# Patient Record
Sex: Male | Born: 1955 | Race: Black or African American | Hispanic: No | Smoking: Never smoker
Health system: Southern US, Community
[De-identification: ages and names within clinical notes are randomized; demographics above are authoritative.]

## PROBLEM LIST (undated history)

## (undated) DIAGNOSIS — M109 Gout, unspecified: Secondary | ICD-10-CM

## (undated) DIAGNOSIS — IMO0002 Reserved for concepts with insufficient information to code with codable children: Secondary | ICD-10-CM

## (undated) DIAGNOSIS — K449 Diaphragmatic hernia without obstruction or gangrene: Secondary | ICD-10-CM

## (undated) DIAGNOSIS — K5732 Diverticulitis of large intestine without perforation or abscess without bleeding: Secondary | ICD-10-CM

## (undated) DIAGNOSIS — R911 Solitary pulmonary nodule: Secondary | ICD-10-CM

## (undated) DIAGNOSIS — F419 Anxiety disorder, unspecified: Secondary | ICD-10-CM

## (undated) DIAGNOSIS — E669 Obesity, unspecified: Secondary | ICD-10-CM

## (undated) DIAGNOSIS — K219 Gastro-esophageal reflux disease without esophagitis: Secondary | ICD-10-CM

## (undated) DIAGNOSIS — M8580 Other specified disorders of bone density and structure, unspecified site: Secondary | ICD-10-CM

## (undated) DIAGNOSIS — R002 Palpitations: Secondary | ICD-10-CM

## (undated) DIAGNOSIS — E785 Hyperlipidemia, unspecified: Secondary | ICD-10-CM

## (undated) DIAGNOSIS — K649 Unspecified hemorrhoids: Secondary | ICD-10-CM

## (undated) HISTORY — DX: Unspecified hemorrhoids: K64.9

## (undated) HISTORY — DX: Hyperlipidemia, unspecified: E78.5

## (undated) HISTORY — DX: Diverticulitis of large intestine without perforation or abscess without bleeding: K57.32

## (undated) HISTORY — DX: Anxiety disorder, unspecified: F41.9

## (undated) HISTORY — DX: Reserved for concepts with insufficient information to code with codable children: IMO0002

## (undated) HISTORY — DX: Other specified disorders of bone density and structure, unspecified site: M85.80

## (undated) HISTORY — DX: Obesity, unspecified: E66.9

## (undated) HISTORY — DX: Solitary pulmonary nodule: R91.1

## (undated) HISTORY — PX: HEMORRHOID BANDING: SHX5850

## (undated) HISTORY — DX: Palpitations: R00.2

## (undated) HISTORY — PX: COLONOSCOPY: SHX174

---

## 1998-05-08 ENCOUNTER — Emergency Department (HOSPITAL_COMMUNITY): Admission: EM | Admit: 1998-05-08 | Discharge: 1998-05-08 | Payer: Self-pay | Admitting: Emergency Medicine

## 1998-05-28 ENCOUNTER — Emergency Department (HOSPITAL_COMMUNITY): Admission: EM | Admit: 1998-05-28 | Discharge: 1998-05-28 | Payer: Self-pay | Admitting: Emergency Medicine

## 1998-05-29 ENCOUNTER — Emergency Department (HOSPITAL_COMMUNITY): Admission: EM | Admit: 1998-05-29 | Discharge: 1998-05-29 | Payer: Self-pay | Admitting: Emergency Medicine

## 1998-05-30 ENCOUNTER — Emergency Department (HOSPITAL_COMMUNITY): Admission: EM | Admit: 1998-05-30 | Discharge: 1998-05-30 | Payer: Self-pay | Admitting: Emergency Medicine

## 1998-06-03 ENCOUNTER — Emergency Department (HOSPITAL_COMMUNITY): Admission: EM | Admit: 1998-06-03 | Discharge: 1998-06-03 | Payer: Self-pay | Admitting: Emergency Medicine

## 1999-05-28 ENCOUNTER — Emergency Department (HOSPITAL_COMMUNITY): Admission: EM | Admit: 1999-05-28 | Discharge: 1999-05-28 | Payer: Self-pay | Admitting: Emergency Medicine

## 2000-02-24 ENCOUNTER — Emergency Department (HOSPITAL_COMMUNITY): Admission: EM | Admit: 2000-02-24 | Discharge: 2000-02-24 | Payer: Self-pay | Admitting: Emergency Medicine

## 2000-04-28 ENCOUNTER — Ambulatory Visit (HOSPITAL_COMMUNITY): Admission: RE | Admit: 2000-04-28 | Discharge: 2000-04-28 | Payer: Self-pay | Admitting: Gastroenterology

## 2000-08-28 ENCOUNTER — Emergency Department (HOSPITAL_COMMUNITY): Admission: EM | Admit: 2000-08-28 | Discharge: 2000-08-28 | Payer: Self-pay | Admitting: Emergency Medicine

## 2000-08-28 ENCOUNTER — Encounter: Payer: Self-pay | Admitting: Emergency Medicine

## 2001-02-15 ENCOUNTER — Emergency Department (HOSPITAL_COMMUNITY): Admission: EM | Admit: 2001-02-15 | Discharge: 2001-02-16 | Payer: Self-pay | Admitting: Emergency Medicine

## 2001-05-28 ENCOUNTER — Emergency Department (HOSPITAL_COMMUNITY): Admission: EM | Admit: 2001-05-28 | Discharge: 2001-05-28 | Payer: Self-pay | Admitting: Emergency Medicine

## 2002-06-04 ENCOUNTER — Emergency Department (HOSPITAL_COMMUNITY): Admission: EM | Admit: 2002-06-04 | Discharge: 2002-06-05 | Payer: Self-pay | Admitting: *Deleted

## 2004-12-10 ENCOUNTER — Emergency Department (HOSPITAL_COMMUNITY): Admission: EM | Admit: 2004-12-10 | Discharge: 2004-12-10 | Payer: Self-pay | Admitting: Emergency Medicine

## 2005-02-16 ENCOUNTER — Emergency Department (HOSPITAL_COMMUNITY): Admission: EM | Admit: 2005-02-16 | Discharge: 2005-02-16 | Payer: Self-pay | Admitting: Emergency Medicine

## 2005-05-16 ENCOUNTER — Emergency Department (HOSPITAL_COMMUNITY): Admission: EM | Admit: 2005-05-16 | Discharge: 2005-05-17 | Payer: Self-pay | Admitting: Emergency Medicine

## 2006-05-03 ENCOUNTER — Encounter: Admission: RE | Admit: 2006-05-03 | Discharge: 2006-05-03 | Payer: Self-pay | Admitting: Internal Medicine

## 2006-05-28 ENCOUNTER — Emergency Department (HOSPITAL_COMMUNITY): Admission: EM | Admit: 2006-05-28 | Discharge: 2006-05-28 | Payer: Self-pay | Admitting: Emergency Medicine

## 2007-10-24 ENCOUNTER — Emergency Department (HOSPITAL_COMMUNITY): Admission: EM | Admit: 2007-10-24 | Discharge: 2007-10-24 | Payer: Self-pay | Admitting: Emergency Medicine

## 2008-06-08 ENCOUNTER — Emergency Department (HOSPITAL_COMMUNITY): Admission: EM | Admit: 2008-06-08 | Discharge: 2008-06-08 | Payer: Self-pay | Admitting: Emergency Medicine

## 2008-08-12 ENCOUNTER — Emergency Department (HOSPITAL_COMMUNITY): Admission: EM | Admit: 2008-08-12 | Discharge: 2008-08-13 | Payer: Self-pay | Admitting: Emergency Medicine

## 2009-05-01 ENCOUNTER — Emergency Department (HOSPITAL_COMMUNITY): Admission: EM | Admit: 2009-05-01 | Discharge: 2009-05-01 | Payer: Self-pay | Admitting: Family Medicine

## 2010-10-23 ENCOUNTER — Emergency Department (HOSPITAL_COMMUNITY): Admission: EM | Admit: 2010-10-23 | Discharge: 2009-11-23 | Payer: Self-pay | Admitting: Emergency Medicine

## 2011-02-09 ENCOUNTER — Inpatient Hospital Stay (INDEPENDENT_AMBULATORY_CARE_PROVIDER_SITE_OTHER)
Admission: RE | Admit: 2011-02-09 | Discharge: 2011-02-09 | Disposition: A | Discharge status: Home / Self Care | Payer: Self-pay | Source: Ambulatory Visit | Attending: Emergency Medicine | Admitting: Emergency Medicine

## 2011-02-09 DIAGNOSIS — K6289 Other specified diseases of anus and rectum: Secondary | ICD-10-CM

## 2011-02-09 DIAGNOSIS — J029 Acute pharyngitis, unspecified: Secondary | ICD-10-CM

## 2011-02-10 ENCOUNTER — Encounter (INDEPENDENT_AMBULATORY_CARE_PROVIDER_SITE_OTHER): Payer: Self-pay | Admitting: *Deleted

## 2011-02-17 NOTE — Letter (Signed)
Summary: Pre Visit Letter Revised  [facility]  [address] [place], [place] [address]   Phone: [phone]  Fax: [phone]        02/10/2011 MRN: [id number] Jerry Larson  [address] [name], [place]  [address]  [place]             Procedure Date:  March 04, 2011   dir col Dr Jarold Motto   Welcome to the Gastroenterology Division at [facility].    You are scheduled to see a nurse for your pre-procedure visit on February 18, 2011 at 11:00am on the 3rd floor at [facility], [address]  We ask that you try to arrive at our office 15 minutes prior to your appointment time to allow for check-in.  Please take a minute to review the attached form.  If you answer "Yes" to one or more of the questions on the first page, we ask that you call the person listed at your earliest opportunity.  If you answer "No" to all of the questions, please complete the rest of the form and bring it to your appointment.    Your nurse visit will consist of discussing your medical and surgical history, your immediate family medical history, and your medications.   If you are unable to list all of your medications on the form, please bring the medication bottles to your appointment and we will list them.  We will need to be aware of both prescribed and over the counter drugs.  We will need to know exact dosage information as well.    Please be prepared to read and sign documents such as consent forms, a financial agreement, and acknowledgement forms.  If necessary, and with your consent, a friend or relative is welcome to sit-in on the nurse visit with you.  Please bring your insurance card so that we may make a copy of it.  If your insurance requires a referral to see a specialist, please bring your referral form from your primary care physician.  No co-pay is required for this nurse visit.     If you cannot keep your appointment, please call [phone] to cancel or  reschedule prior to your appointment date.  This allows [place] the opportunity to schedule an appointment for another patient in need of care.    Thank you for choosing [facility] for your medical needs.  We appreciate the opportunity to care for you.  Please visit [place] at our website  to learn more about our practice.  Sincerely, The Gastroenterology Division

## 2011-02-18 ENCOUNTER — Ambulatory Visit (AMBULATORY_SURGERY_CENTER): Payer: Self-pay | Admitting: *Deleted

## 2011-02-18 VITALS — Ht 65.0 in | Wt 223.0 lb

## 2011-02-18 DIAGNOSIS — Z1211 Encounter for screening for malignant neoplasm of colon: Secondary | ICD-10-CM

## 2011-02-18 MED ORDER — PEG-KCL-NACL-NASULF-NA ASC-C 100 G PO SOLR: 1.0000 | Freq: Once | ORAL | Status: AC

## 2011-02-23 LAB — POCT I-STAT, CHEM 8
BUN: 15 mg/dL (ref 6–23)
Calcium, Ion: 1.25 mmol/L (ref 1.12–1.32)
Chloride: 103 mEq/L (ref 96–112)
Creatinine, Ser: 1.2 mg/dL (ref 0.4–1.5)
Glucose, Bld: 87 mg/dL (ref 70–99)
TCO2: 30 mmol/L (ref 0–100)

## 2011-03-03 ENCOUNTER — Other Ambulatory Visit: Payer: Self-pay | Admitting: *Deleted

## 2011-03-03 ENCOUNTER — Telehealth: Payer: Self-pay | Admitting: Gastroenterology

## 2011-03-03 ENCOUNTER — Encounter: Payer: Self-pay | Admitting: Gastroenterology

## 2011-03-03 DIAGNOSIS — Z1211 Encounter for screening for malignant neoplasm of colon: Secondary | ICD-10-CM

## 2011-03-03 NOTE — Telephone Encounter (Signed)
Unable to locate [facility] at pt's location.  Called patient and got [facility] preferred [facility].  Called in [facility] as per original order.

## 2011-03-04 ENCOUNTER — Encounter: Payer: Self-pay | Admitting: Gastroenterology

## 2011-03-04 ENCOUNTER — Ambulatory Visit (AMBULATORY_SURGERY_CENTER): Payer: Self-pay | Admitting: Gastroenterology

## 2011-03-04 VITALS — BP 119/64 | HR 62 | Temp 97.3°F | Resp 15 | Ht 65.0 in | Wt 223.0 lb

## 2011-03-04 DIAGNOSIS — Z1211 Encounter for screening for malignant neoplasm of colon: Secondary | ICD-10-CM

## 2011-03-04 DIAGNOSIS — K6289 Other specified diseases of anus and rectum: Secondary | ICD-10-CM

## 2011-03-04 DIAGNOSIS — K625 Hemorrhage of anus and rectum: Secondary | ICD-10-CM

## 2011-03-04 DIAGNOSIS — K5730 Diverticulosis of large intestine without perforation or abscess without bleeding: Secondary | ICD-10-CM

## 2011-03-04 MED ORDER — SODIUM CHLORIDE 0.9 % IV SOLN: 500.0000 mL | INTRAVENOUS | Status: DC

## 2011-03-04 NOTE — Patient Instructions (Addendum)
Please refer to your blue and green sheets for D/C instructions  Findings: Diverticulosis-Handout given  Please eat a diet that is high in fiber  Repeat exam in 10 years (2022)

## 2011-03-05 ENCOUNTER — Telehealth: Payer: Self-pay | Admitting: *Deleted

## 2011-03-05 NOTE — Telephone Encounter (Signed)

## 2011-04-03 NOTE — Procedures (Signed)
[place]  Patient:    Jerry Larson, Jerry Larson                        MRN: [id number] Proc. Date: 04/28/00 Adm. Date:  [id number] Disc. Date: [id number] Attending:  Charna Elizabeth CC:         Kern Reap, M.D.                           Procedure Report  DATE OF BIRTH:  1956/04/20  REFERRING PHYSICIAN:  Kern Reap, M.D.  PROCEDURE PERFORMED:  Colonoscopy.  ENDOSCOPIST:  Anselmo Rod, M.D.  INSTRUMENT USED:  Olympus video colonoscope.  INDICATIONS FOR PROCEDURE:  Rectal bleeding and family history of colon cancer in a 55 year old black male rule out colonic polyps, arteriovenous malformations, masses, hemorrhoids, etc.  PREPROCEDURE PREPARATION:  Informed consent was procured from the patient. The patient was fasted for eight hours prior to the procedure and prepped with a bottle of magnesium citrate and a gallon of NuLytely the night prior to the procedure.  PREPROCEDURE PHYSICAL:  The patient had stable vital signs.  Neck supple. Chest clear to auscultation.  S1, S2 regular.  Abdomen soft with normal abdominal bowel sounds.  No hepatosplenomegaly, no masses palpable.  DESCRIPTION OF PROCEDURE:  The patient was placed in the left lateral decubitus position and sedated with 75 mg of Demerol and 6 mg of Versed intravenously.  Once the patient was adequately sedated and maintained on low-flow oxygen and continuous cardiac monitoring, the Olympus video colonoscope was advanced from the rectum to the cecum without difficulty. Except for small internal hemorrhoids, no other abnormality was seen.  There was no evidence of masses, polyps, erosions, ulcerations or diverticulosis. The procedure was complete to the cecum.  The appendicular orifice and the ileocecal valve were clearly visualized and appeared normal.  IMPRESSION:  Normal colonoscopy except for small nonbleeding internal hemorrhoids.  RECOMMENDATIONS:  The patient has been  advised to increase the fluid and fiber in his diet and follow up in the office in the next two weeks.  Repeat colonoscopy recommended in the next five years or earlier if he develops any symptoms in the interim considering his family history of colon cancer.DD: 04/28/00 TD:  05/02/00 Job: [id number]

## 2011-05-07 ENCOUNTER — Inpatient Hospital Stay (INDEPENDENT_AMBULATORY_CARE_PROVIDER_SITE_OTHER)
Admission: RE | Admit: 2011-05-07 | Discharge: 2011-05-07 | Disposition: A | Discharge status: Home / Self Care | Payer: Self-pay | Source: Ambulatory Visit | Attending: Family Medicine | Admitting: Family Medicine

## 2011-05-07 DIAGNOSIS — R6889 Other general symptoms and signs: Secondary | ICD-10-CM

## 2011-05-07 LAB — POCT I-STAT, CHEM 8
BUN: 13 mg/dL (ref 6–23)
Creatinine, Ser: 1.3 mg/dL (ref 0.50–1.35)
Glucose, Bld: 103 mg/dL — ABNORMAL HIGH (ref 70–99)
Hemoglobin: 16.7 g/dL (ref 13.0–17.0)
TCO2: 26 mmol/L (ref 0–100)

## 2011-07-01 NOTE — Progress Notes (Signed)
Addended by: Virgel Paling on: 07/01/2011 04:54 PM   Modules accepted: Orders, Level of Service

## 2011-07-02 ENCOUNTER — Inpatient Hospital Stay (INDEPENDENT_AMBULATORY_CARE_PROVIDER_SITE_OTHER)
Admission: RE | Admit: 2011-07-02 | Discharge: 2011-07-02 | Disposition: A | Discharge status: Home / Self Care | Payer: Self-pay | Source: Ambulatory Visit | Attending: Emergency Medicine | Admitting: Emergency Medicine

## 2011-07-02 ENCOUNTER — Ambulatory Visit (INDEPENDENT_AMBULATORY_CARE_PROVIDER_SITE_OTHER): Payer: Self-pay

## 2011-07-02 DIAGNOSIS — IMO0002 Reserved for concepts with insufficient information to code with codable children: Secondary | ICD-10-CM

## 2011-07-02 LAB — POCT URINALYSIS DIP (DEVICE)
Hgb urine dipstick: NEGATIVE
Ketones, ur: NEGATIVE mg/dL
Protein, ur: NEGATIVE mg/dL
Specific Gravity, Urine: 1.02 (ref 1.005–1.030)
Urobilinogen, UA: 0.2 mg/dL (ref 0.0–1.0)

## 2011-08-24 LAB — COMPREHENSIVE METABOLIC PANEL
AST: 30
BUN: 9
CO2: 28
Calcium: 9.2
Chloride: 104
Creatinine, Ser: 1.03
GFR calc Af Amer: >60
GFR calc non Af Amer: >60
Total Bilirubin: 1

## 2011-08-24 LAB — POCT CARDIAC MARKERS
CKMB, poc: 2.5
Myoglobin, poc: 109
Myoglobin, poc: 158
Operator id: 4001
Operator id: 4295
Troponin i, poc: <0.05
Troponin i, poc: <0.05

## 2011-08-24 LAB — CBC
HCT: 40.9
MCHC: 33.6
MCV: 89.9
RBC: 4.55

## 2011-08-24 LAB — DIFFERENTIAL
Basophils Absolute: 0.1
Eosinophils Relative: 2
Lymphocytes Relative: 40
Lymphs Abs: 2.3
Neutro Abs: 2.7
Neutrophils Relative %: 49

## 2011-08-24 LAB — D-DIMER, QUANTITATIVE: D-Dimer, Quant: <0.22

## 2011-11-29 ENCOUNTER — Emergency Department (HOSPITAL_COMMUNITY)
Admission: EM | Admit: 2011-11-29 | Discharge: 2011-11-29 | Disposition: A | Discharge status: Home / Self Care | Payer: Self-pay | Source: Home / Self Care | Attending: Emergency Medicine | Admitting: Emergency Medicine

## 2011-11-29 ENCOUNTER — Emergency Department (INDEPENDENT_AMBULATORY_CARE_PROVIDER_SITE_OTHER): Payer: Self-pay

## 2011-11-29 ENCOUNTER — Encounter (HOSPITAL_COMMUNITY): Payer: Self-pay | Admitting: *Deleted

## 2011-11-29 DIAGNOSIS — S93409A Sprain of unspecified ligament of unspecified ankle, initial encounter: Secondary | ICD-10-CM

## 2011-11-29 MED ORDER — MELOXICAM 7.5 MG PO TABS: 7.5000 mg | ORAL_TABLET | Freq: Every day | ORAL | Status: DC

## 2011-11-29 NOTE — ED Provider Notes (Signed)
History     CSN: [id number]  Arrival date & time 11/29/11  1117   First MD Initiated Contact with Patient 11/29/11 1127      Chief Complaint  Patient presents with  . Ankle Pain    (Consider location/radiation/quality/duration/timing/severity/associated sxs/prior treatment) HPI Comments: Ankle pain last night my twisted my R ankle and fell, this morning woke up and its swollen and hurts when i walk on it"  "  Patient is a 56 y.o. male presenting with ankle pain. The history is provided by the patient.  Ankle Pain  The incident occurred yesterday. The incident occurred at work. The injury mechanism was a fall and torsion. The pain is present in the right ankle. The pain is at a severity of 3/10. The pain is mild. Pertinent negatives include no numbness, no inability to bear weight, no muscle weakness, no loss of sensation and no tingling. The symptoms are aggravated by activity and bearing weight. He has tried nothing for the symptoms. The treatment provided no relief.    Past Medical History  Diagnosis Date  . Hyperlipidemia     Past Surgical History  Procedure Date  . Colonoscopy     Family History  Problem Relation Age of Onset  . Pancreatic cancer Father     History  Substance Use Topics  . Smoking status: Never Smoker   . Smokeless tobacco: Not on file  . Alcohol Use: No      Review of Systems  Constitutional: Negative for fever and fatigue.  Musculoskeletal: Positive for joint swelling. Negative for back pain.  Neurological: Negative for tingling, weakness and numbness.    Allergies  Lipitor and Zocor  Home Medications   Current Outpatient Rx  Name Route Sig Dispense Refill  . FAMOTIDINE 10 MG PO TABS Oral Take 10 mg by mouth 2 (two) times daily.      BP 125/73  Pulse 68  Temp(Src) 98.9 F (37.2 C) (Oral)  Resp 17  SpO2 97%  Physical Exam  Nursing note and vitals reviewed. Constitutional: He appears well-nourished. No distress.    Musculoskeletal: He exhibits tenderness. He exhibits no edema.       Right ankle: He exhibits decreased range of motion and swelling. He exhibits no ecchymosis, no deformity and normal pulse. tenderness. Lateral malleolus tenderness found. No medial malleolus tenderness found. Achilles tendon normal. Achilles tendon exhibits no pain.  Neurological: He is alert. He has normal strength. No sensory deficit.  Skin: Skin is warm. No bruising, no ecchymosis and no rash noted. No erythema.    ED Course  Procedures (including critical care time)  Labs Reviewed - No data to display Dg Ankle Complete Right  11/29/2011  *RADIOLOGY REPORT*  Clinical Data: Twisting injury, pain.  RIGHT ANKLE - COMPLETE 3+ VIEW  Comparison: None.  Findings: There is some soft tissue swelling about the ankle, greater on the lateral side.  No fracture or dislocation is identified.  Calcaneal spurring noted.  IMPRESSION: Soft tissue swelling without underlying acute bony or joint abnormality.  Original Report Authenticated By: Bernadene Bell. Maricela Curet, M.D.     No diagnosis found.    MDM  Ankle sprain-strain lateral malleolus, stable ankle- RICE measures and NSAIDS        Jimmie Molly, MD 11/29/11 1340

## 2011-11-29 NOTE — ED Notes (Signed)
Fell yesterday onto ground, rotating right ankle inward at time of fall, denies other injury or dizziness at time of fall, states ankle is painful, ambulates with difficulty

## 2011-12-19 ENCOUNTER — Other Ambulatory Visit: Payer: Self-pay

## 2011-12-19 ENCOUNTER — Emergency Department (INDEPENDENT_AMBULATORY_CARE_PROVIDER_SITE_OTHER): Payer: Self-pay

## 2011-12-19 ENCOUNTER — Emergency Department (HOSPITAL_COMMUNITY)
Admission: EM | Admit: 2011-12-19 | Discharge: 2011-12-19 | Disposition: A | Discharge status: Home / Self Care | Payer: Self-pay | Source: Home / Self Care | Attending: Family Medicine | Admitting: Family Medicine

## 2011-12-19 ENCOUNTER — Encounter (HOSPITAL_COMMUNITY): Payer: Self-pay

## 2011-12-19 DIAGNOSIS — R0789 Other chest pain: Secondary | ICD-10-CM

## 2011-12-19 DIAGNOSIS — K219 Gastro-esophageal reflux disease without esophagitis: Secondary | ICD-10-CM

## 2011-12-19 DIAGNOSIS — R059 Cough, unspecified: Secondary | ICD-10-CM

## 2011-12-19 DIAGNOSIS — R05 Cough: Secondary | ICD-10-CM

## 2011-12-19 HISTORY — DX: Gastro-esophageal reflux disease without esophagitis: K21.9

## 2011-12-19 MED ORDER — BENZONATATE 100 MG PO CAPS: 100.0000 mg | ORAL_CAPSULE | Freq: Three times a day (TID) | ORAL | Status: AC

## 2011-12-19 MED ORDER — OMEPRAZOLE 20 MG PO CPDR: 20.0000 mg | DELAYED_RELEASE_CAPSULE | Freq: Two times a day (BID) | ORAL | Status: DC | PRN

## 2011-12-19 NOTE — ED Provider Notes (Signed)
History     CSN: [id number]  Arrival date & time 12/19/11  0903   First MD Initiated Contact with Patient 12/19/11 [phone]      Chief Complaint  Patient presents with  . Cough    (Consider location/radiation/quality/duration/timing/severity/associated sxs/prior treatment) HPI Comments: 56 y/o male obese with h/o hyperlipidemia here c/o nagging cough for 1 year. Patient is non smoker lives with others who smoke in the house. Cough is non productive. Has been diagnosed with hiatal hernia and GERD  in the past and takes Pepcid AC over the counter daily. Feels acid coming up his throat some times. Cough worse in last week also associated with nasal congestion. No fever or chills. Also reports has had some intermittent chest pain in left side with radiation to left shoulder. No associated with diaphoresis, nausea or vomiting, no increased shortness of breath on exertion has chronic mild lower leg swelling, no PND. No current chest pain or shortness of breath here.    Past Medical History  Diagnosis Date  . Hyperlipidemia   . Acid reflux     Past Surgical History  Procedure Date  . Colonoscopy     Family History  Problem Relation Age of Onset  . Pancreatic cancer Father     History  Substance Use Topics  . Smoking status: Never Smoker   . Smokeless tobacco: Not on file  . Alcohol Use: No      Review of Systems  Constitutional: Negative for fever, chills, diaphoresis, appetite change, fatigue and unexpected weight change.  Respiratory: Positive for cough. Negative for chest tightness, shortness of breath and wheezing.   Cardiovascular: Negative for palpitations and leg swelling.       Chest pain no currently as per HPI  Gastrointestinal:       Acid reflux  Skin: Negative for rash.  Neurological: Negative for dizziness and headaches.    Allergies  Lipitor and Zocor  Home Medications   Current Outpatient Rx  Name Route Sig Dispense Refill  . FAMOTIDINE 10 MG PO CHEW  Oral Chew 10 mg by mouth 2 (two) times daily.    Marland Kitchen BENZONATATE 100 MG PO CAPS Oral Take 1 capsule (100 mg total) by mouth every 8 (eight) hours. 21 capsule 0  . MELOXICAM 7.5 MG PO TABS Oral Take 1 tablet (7.5 mg total) by mouth daily. 14 tablet 0  . OMEPRAZOLE 20 MG PO CPDR Oral Take 1 capsule (20 mg total) by mouth 2 (two) times daily as needed. 60 capsule 0    BP 139/83  Pulse 69  Temp(Src) 98.2 F (36.8 C) (Oral)  Resp 17  SpO2 96%  Physical Exam  Nursing note and vitals reviewed. Constitutional: He is oriented to person, place, and time. He appears well-developed and well-nourished. No distress.  HENT:  Head: Normocephalic and atraumatic.  Mouth/Throat: No oropharyngeal exudate.       Mild nasal congestion.  Eyes: Conjunctivae and EOM are normal. Pupils are equal, round, and reactive to light. No scleral icterus.  Neck: Neck supple. No JVD present.  Cardiovascular: Normal rate, regular rhythm, normal heart sounds and intact distal pulses.  Exam reveals no gallop and no friction rub.   No murmur heard.      Trace bilateral LEE  Pulmonary/Chest: Effort normal and breath sounds normal. No respiratory distress. He has no wheezes. He has no rales. He exhibits no tenderness.  Abdominal: There is no tenderness.       obese  Lymphadenopathy:  He has no cervical adenopathy.  Neurological: He is alert and oriented to person, place, and time.  Skin: No rash noted.    ED Course  Procedures (including critical care time)  Labs Reviewed - No data to display Dg Chest 2 View  12/19/2011  *RADIOLOGY REPORT*  Clinical Data:  persistent cough  CHEST - 2 VIEW  Comparison:  10/24/2007  Findings:  The heart size and mediastinal contours are within normal limits.  Both lungs are clear.  The visualized skeletal structures are unremarkable.  IMPRESSION: No active cardiopulmonary disease.  Original Report Authenticated By: Judie Petit. Ruel Favors, M.D.     1. Cough   2. GERD (gastroesophageal reflux  disease)   3. Atypical chest pain       MDM  EKG: NSR, rate 62bpm, no ischemic changes. Added prilosec bid. Tessalon Perles. Asked to re establish care with a pcp for risk stratification possible referral for cardiac stress test; also contact info for cardiologist provided.          Sharin Grave, MD 12/20/11 1241

## 2011-12-19 NOTE — ED Notes (Signed)
Pt has had nagging cough for one year, denies smoking and has acid reflux and takes otc pepcid.

## 2012-01-28 ENCOUNTER — Encounter (HOSPITAL_COMMUNITY): Payer: Self-pay | Admitting: *Deleted

## 2012-01-28 ENCOUNTER — Other Ambulatory Visit: Payer: Self-pay

## 2012-01-28 ENCOUNTER — Emergency Department (HOSPITAL_COMMUNITY): Payer: Self-pay

## 2012-01-28 ENCOUNTER — Emergency Department (HOSPITAL_COMMUNITY)
Admission: EM | Admit: 2012-01-28 | Discharge: 2012-01-28 | Disposition: A | Discharge status: Home / Self Care | Payer: Self-pay | Attending: Emergency Medicine | Admitting: Emergency Medicine

## 2012-01-28 DIAGNOSIS — R0789 Other chest pain: Secondary | ICD-10-CM

## 2012-01-28 DIAGNOSIS — R079 Chest pain, unspecified: Secondary | ICD-10-CM | POA: Insufficient documentation

## 2012-01-28 HISTORY — DX: Diaphragmatic hernia without obstruction or gangrene: K44.9

## 2012-01-28 LAB — POCT I-STAT, CHEM 8
BUN: 11 mg/dL (ref 6–23)
Chloride: 102 mEq/L (ref 96–112)
Potassium: 3.9 mEq/L (ref 3.5–5.1)
Sodium: 140 mEq/L (ref 135–145)

## 2012-01-28 LAB — POCT I-STAT TROPONIN I
Troponin i, poc: 0.01 ng/mL (ref 0.00–0.08)
Troponin i, poc: 0 ng/mL (ref 0.00–0.08)

## 2012-01-28 NOTE — ED Notes (Signed)
Pt reports left sided chest pain that feels like it is trying to go down his left arm with no associated symptoms. ekg shows NSR.

## 2012-01-28 NOTE — ED Provider Notes (Signed)
History     CSN: [id number]  Arrival date & time 01/28/12  1551   None     Chief Complaint  Patient presents with  . Chest Pain    (Consider location/radiation/quality/duration/timing/severity/associated sxs/prior treatment) HPI Complains of left anterior chest pain dull in nature radiating to left shoulder onset 3:15 PM today is lasted 30 minutes resolved spontaneously. Patient reports several episodes since the initial episode lasting 7 or 8 seconds no associated shortness of breath nausea or sweatiness no treatment prior to coming here nothing makes symptoms better or worse. Patient reports he exercises regularly, doing cardio, the last time 2 days ago without ever having had chest discomfort. Risk factors male history of hypercholesterolemia otherwise negative Past Medical History  Diagnosis Date  . Hyperlipidemia   . Acid reflux   . Hiatal hernia     Past Surgical History  Procedure Date  . Colonoscopy     Family History  Problem Relation Age of Onset  . Pancreatic cancer Father     History  Substance Use Topics  . Smoking status: Never Smoker   . Smokeless tobacco: Not on file  . Alcohol Use: No      Review of Systems  Constitutional: Negative.   HENT: Negative.   Respiratory: Negative.   Cardiovascular: Positive for chest pain.  Gastrointestinal: Negative.   Musculoskeletal: Negative.   Skin: Negative.   Neurological: Negative.   Hematological: Negative.   Psychiatric/Behavioral: Negative.     Allergies  Lipitor and Zocor  Home Medications   Current Outpatient Rx  Name Route Sig Dispense Refill  . OMEPRAZOLE 20 MG PO CPDR Oral Take 20 mg by mouth 2 (two) times daily as needed. For heart burn    . MELOXICAM 7.5 MG PO TABS Oral Take 1 tablet (7.5 mg total) by mouth daily. 14 tablet 0    BP 127/80  Pulse 64  Temp(Src) 97.2 F (36.2 C) (Oral)  Resp 20  SpO2 96%  Physical Exam  Nursing note and vitals reviewed. Constitutional: He appears  well-developed and well-nourished.  HENT:  Head: Normocephalic and atraumatic.  Eyes: Conjunctivae are normal. Pupils are equal, round, and reactive to light.  Neck: Neck supple. No tracheal deviation present. No thyromegaly present.  Cardiovascular: Normal rate and regular rhythm.   No murmur heard. Pulmonary/Chest: Effort normal and breath sounds normal.  Abdominal: Soft. Bowel sounds are normal. He exhibits no distension. There is no tenderness.       OBese  Musculoskeletal: Normal range of motion. He exhibits no edema and no tenderness.  Neurological: He is alert. Coordination normal.  Skin: Skin is warm and dry. No rash noted.  Psychiatric: He has a normal mood and affect.    ED Course  Procedures (including critical care time)  Date: 01/28/2012  Rate: 65  Rhythm: normal sinus rhythm  QRS Axis: normal  Intervals: normal  ST/T Wave abnormalities: normal  Conduction Disutrbances: none  Narrative Interpretation: unremarkable Unchanged from 12/19/2011  Results for orders placed during the hospital encounter of 01/28/12  POCT I-STAT, CHEM 8      Component Value Range   Sodium 140  135 - 145 (mEq/L)   Potassium 3.9  3.5 - 5.1 (mEq/L)   Chloride 102  96 - 112 (mEq/L)   BUN 11  6 - 23 (mg/dL)   Creatinine, Ser 7.82  0.50 - 1.35 (mg/dL)   Glucose, Bld 956 (*) 70 - 99 (mg/dL)   Calcium, Ion 2.13  0.86 - 1.32 (mmol/L)  TCO2 26  0 - 100 (mmol/L)   Hemoglobin 15.3  13.0 - 17.0 (g/dL)   HCT 16.1  09.6 - 04.5 (%)  POCT I-STAT TROPONIN I      Component Value Range   Troponin i, poc 0.01  0.00 - 0.08 (ng/mL)   Comment 3            Dg Chest 2 View  01/28/2012  *RADIOLOGY REPORT*  Clinical Data: Chest pain  CHEST - 2 VIEW  Comparison: 12/19/2011  Findings: Heart size is normal.  No pleural effusion or edema identified.  No airspace consolidation identified.  Lung volumes are low.  Review of the visualized osseous structures is negative.  IMPRESSION:  1.  No acute cardiopulmonary  abnormalities.  Original Report Authenticated By: Rosealee Albee, M.D.    Labs Reviewed  POCT I-STAT, CHEM 8 - Abnormal; Notable for the following:    Glucose, Bld 118 (*)    All other components within normal limits  POCT I-STAT TROPONIN I   Dg Chest 2 View  01/28/2012  *RADIOLOGY REPORT*  Clinical Data: Chest pain  CHEST - 2 VIEW  Comparison: 12/19/2011  Findings: Heart size is normal.  No pleural effusion or edema identified.  No airspace consolidation identified.  Lung volumes are low.  Review of the visualized osseous structures is negative.  IMPRESSION:  1.  No acute cardiopulmonary abnormalities.  Original Report Authenticated By: Rosealee Albee, M.D.     No diagnosis found.    MDM  Symptoms atypical for acute coronary syndrome in light of nonexertional lasting only a few seconds at a time. Patient has normal EKG I feel that he is suitable for outpatient cardiac evaluation. Spoke with Dr.Hilty who agrees to see patient for close outpatient cardiac evaluation Diagnoses atypical chest pain        Doug Sou, MD 01/28/12 2031

## 2012-01-28 NOTE — Discharge Instructions (Signed)
Chest Pain (Nonspecific) It is often hard to give a specific diagnosis for the cause of chest pain. There is always a chance that your pain could be related to something serious, such as a heart attack or a blood clot in the lungs. You need to follow up with your caregiver for further evaluation. CAUSES   Heartburn.   Pneumonia or bronchitis.   Anxiety or stress.   Inflammation around your heart (pericarditis) or lung (pleuritis or pleurisy).   A blood clot in the lung.   A collapsed lung (pneumothorax). It can develop suddenly on its own (spontaneous pneumothorax) or from injury (trauma) to the chest.   Shingles infection (herpes zoster virus).  The chest wall is composed of bones, muscles, and cartilage. Any of these can be the source of the pain.  The bones can be bruised by injury.   The muscles or cartilage can be strained by coughing or overwork.   The cartilage can be affected by inflammation and become sore (costochondritis).  DIAGNOSIS  Lab tests or other studies, such as X-rays, electrocardiography, stress testing, or cardiac imaging, may be needed to find the cause of your pain.  TREATMENT   Treatment depends on what may be causing your chest pain. Treatment may include:   Acid blockers for heartburn.   Anti-inflammatory medicine.   Pain medicine for inflammatory conditions.   Antibiotics if an infection is present.   You may be advised to change lifestyle habits. This includes stopping smoking and avoiding alcohol, caffeine, and chocolate.   You may be advised to keep your head raised (elevated) when sleeping. This reduces the chance of acid going backward from your stomach into your esophagus.   Most of the time, nonspecific chest pain will improve within 2 to 3 days with rest and mild pain medicine.  HOME CARE INSTRUCTIONS   If antibiotics were prescribed, take your antibiotics as directed. Finish them even if you start to feel better.   For the next few  days, avoid physical activities that bring on chest pain. Continue physical activities as directed.   Do not smoke.   Avoid drinking alcohol.   Only take over-the-counter or prescription medicine for pain, discomfort, or fever as directed by your caregiver.   Follow your caregiver's suggestions for further testing if your chest pain does not go away.   Keep any follow-up appointments you made. If you do not go to an appointment, you could develop lasting (chronic) problems with pain. If there is any problem keeping an appointment, you must call to reschedule.  SEEK MEDICAL CARE IF:   You think you are having problems from the medicine you are taking. Read your medicine instructions carefully.   Your chest pain does not go away, even after treatment.   You develop a rash with blisters on your chest.  SEEK IMMEDIATE MEDICAL CARE IF:   You have increased chest pain or pain that spreads to your arm, neck, jaw, back, or abdomen.   You develop shortness of breath, an increasing cough, or you are coughing up blood.   You have severe back or abdominal pain, feel nauseous, or vomit.   You develop severe weakness, fainting, or chills.   You have a fever.  THIS IS AN EMERGENCY. Do not wait to see if the pain will go away. Get medical help at once. Call your local emergency services (911 in [place] Do not drive yourself to the hospital. MAKE SURE YOU:   Understand these instructions.     Will watch your condition.   Will get help right away if you are not doing well or get worse.  Document Released: 08/12/2005 Document Revised: 10/22/2011 Document Reviewed: 06/07/2008 [facility] Patient Information 2012 [place], [place].  Your labs and ECG today were normal.  Please follow-up with the cardiologist listed for additional evaluation.

## 2012-04-17 ENCOUNTER — Emergency Department (HOSPITAL_COMMUNITY)
Admission: EM | Admit: 2012-04-17 | Discharge: 2012-04-17 | Disposition: A | Discharge status: Home / Self Care | Payer: Self-pay | Attending: Emergency Medicine | Admitting: Emergency Medicine

## 2012-04-17 ENCOUNTER — Encounter (HOSPITAL_COMMUNITY): Payer: Self-pay | Admitting: Emergency Medicine

## 2012-04-17 ENCOUNTER — Emergency Department (HOSPITAL_COMMUNITY): Payer: Self-pay

## 2012-04-17 DIAGNOSIS — K219 Gastro-esophageal reflux disease without esophagitis: Secondary | ICD-10-CM | POA: Insufficient documentation

## 2012-04-17 DIAGNOSIS — E785 Hyperlipidemia, unspecified: Secondary | ICD-10-CM | POA: Insufficient documentation

## 2012-04-17 DIAGNOSIS — M25569 Pain in unspecified knee: Secondary | ICD-10-CM | POA: Insufficient documentation

## 2012-04-17 DIAGNOSIS — K449 Diaphragmatic hernia without obstruction or gangrene: Secondary | ICD-10-CM | POA: Insufficient documentation

## 2012-04-17 MED ORDER — HYDROCODONE-ACETAMINOPHEN 5-325 MG PO TABS
1.0000 | ORAL_TABLET | Freq: Once | ORAL | Status: AC
  Administered 2012-04-17: 1 via ORAL
  Filled 2012-04-17: qty 1

## 2012-04-17 MED ORDER — KETOROLAC TROMETHAMINE 60 MG/2ML IM SOLN
60.0000 mg | Freq: Once | INTRAMUSCULAR | Status: AC
  Administered 2012-04-17: 60 mg via INTRAMUSCULAR
  Filled 2012-04-17: qty 2

## 2012-04-17 MED ORDER — HYDROCODONE-ACETAMINOPHEN 5-325 MG PO TABS: 1.0000 | ORAL_TABLET | ORAL | Status: AC | PRN

## 2012-04-17 NOTE — ED Provider Notes (Signed)
History  Scribed for Nat Christen, MD, the patient was seen in room STRE4/STRE4. This chart was scribed by Candelaria Stagers. The patient's care started at 3:08 PM     CSN: [id number]  Arrival date & time 04/17/12  1435   First MD Initiated Contact with Patient 04/17/12 1504      Chief Complaint  Patient presents with  . Knee Pain    left knee    Knee Pain    Jerry Larson is a 56 y.o. male who presents to the Emergency Department complaining of constant left knee pain that started three days ago.  Pt states that three days ago he used the leg press at the gym and possibly put too much weight on the machine.  Pt came in on crutches.  He denies falls and states that he did not hear a pop during the leg press incident.   Past Medical History  Diagnosis Date  . Hyperlipidemia   . Acid reflux   . Hiatal hernia     Past Surgical History  Procedure Date  . Colonoscopy     Family History  Problem Relation Age of Onset  . Pancreatic cancer Father     History  Substance Use Topics  . Smoking status: Never Smoker   . Smokeless tobacco: Not on file  . Alcohol Use: No      Review of Systems  Constitutional: Negative.  Negative for fever and chills.  HENT: Negative.   Eyes: Negative.   Respiratory: Negative.  Negative for shortness of breath.   Cardiovascular: Negative.  Negative for chest pain.  Gastrointestinal: Negative.  Negative for nausea, vomiting and abdominal pain.  Genitourinary: Negative.   Musculoskeletal: Positive for joint swelling and arthralgias (left knee pain). Negative for back pain.  Skin: Negative.  Negative for color change and rash.  Neurological: Negative for syncope and headaches.  Hematological: Negative.  Negative for adenopathy.  Psychiatric/Behavioral: Negative.  Negative for confusion.  All other systems reviewed and are negative.    Allergies  Lipitor and Zocor  Home Medications   Current Outpatient Rx  Name Route Sig Dispense  Refill  . OMEPRAZOLE 20 MG PO CPDR Oral Take 20 mg by mouth 2 (two) times daily as needed. For heart burn      BP 138/68  Pulse 78  Temp(Src) 98.1 F (36.7 C) (Oral)  Resp 18  SpO2 96%  Physical Exam  Nursing note and vitals reviewed. Constitutional: He is oriented to person, place, and time. He appears well-developed and well-nourished. No distress.  HENT:  Head: Normocephalic and atraumatic.  Eyes: EOM are normal. Pupils are equal, round, and reactive to light.  Neck: Neck supple. No tracheal deviation present.  Cardiovascular: Normal rate.   Pulmonary/Chest: Effort normal. No respiratory distress.  Abdominal: Soft. He exhibits no distension.  Musculoskeletal: Normal range of motion. He exhibits no edema.       Mild swelling proximal to his left knee and around the knee.  Tenderness on palpation.    Neurological: He is alert and oriented to person, place, and time. No sensory deficit.  Skin: Skin is warm and dry.  Psychiatric: He has a normal mood and affect. His behavior is normal.    ED Course  Procedures   DIAGNOSTIC STUDIES: Oxygen Saturation is 96% on room air, normal by my interpretation.    COORDINATION OF CARE:  3:11PM DG Knee Complete 4 Views Left    Labs Reviewed - No data to display Dg Knee  Complete 4 Views Left  04/17/2012  *RADIOLOGY REPORT*  Clinical Data: Knee pain.  Weight lifting injury.  LEFT KNEE - COMPLETE 4+ VIEW  Comparison: None.  Findings: No acute bony abnormality.  Specifically, no fracture, subluxation, or dislocation.  Soft tissues are intact.  No joint effusion.  IMPRESSION: No bony abnormality.  No acute findings.  Original Report Authenticated By: Cyndie Chime, M.D.     No diagnosis found.    MDM  Patient with no acute fractures or abnormalities on his x-ray.  Given patient's swelling proximal to his knee and significant pain with extension of his leg I concern for possible partial muscle tear or other quadriceps injury.  Patient  does have some flexion so cannot have a complete quadriceps tendon tear.  I going to place the patient in a knee immobilizer and have him followup with orthopedics this week.  Patient Re: has crutches at bedside.  I personally performed the services described in this documentation, which was scribed in my presence. The recorded information has been reviewed and considered.       Nat Christen, MD 04/17/12 [phone]

## 2012-04-17 NOTE — Progress Notes (Signed)
Orthopedic Tech Progress Note Patient Details:  Jerry Larson 06-Oct-1956 [id number]  Ortho Devices Type of Ortho Device: Knee Immobilizer Ortho Device/Splint Interventions: Application   Cammer, Mickie Bail 04/17/2012, 4:31 PM

## 2012-04-17 NOTE — ED Notes (Addendum)
Pt reports left knee pain onset Friday. Pt has been to the gym recently. Pt presents with crutches.

## 2012-04-17 NOTE — Discharge Instructions (Signed)
Knee Pain The knee is the complex joint between your thigh and your lower leg. It is made up of bones, tendons, ligaments, and cartilage. The bones that make up the knee are:  The femur in the thigh.   The tibia and fibula in the lower leg.   The patella or kneecap riding in the groove on the lower femur.  CAUSES  Knee pain is a common complaint with many causes. A few of these causes are:  Injury, such as:   A ruptured ligament or tendon injury.   Torn cartilage.   Medical conditions, such as:   Gout   Arthritis   Infections   Overuse, over training or overdoing a physical activity.  Knee pain can be minor or severe. Knee pain can accompany debilitating injury. Minor knee problems often respond well to self-care measures or get well on their own. More serious injuries may need medical intervention or even surgery. SYMPTOMS The knee is complex. Symptoms of knee problems can vary widely. Some of the problems are:  Pain with movement and weight bearing.   Swelling and tenderness.   Buckling of the knee.   Inability to straighten or extend your knee.   Your knee locks and you cannot straighten it.   Warmth and redness with pain and fever.   Deformity or dislocation of the kneecap.  DIAGNOSIS  Determining what is wrong may be very straight forward such as when there is an injury. It can also be challenging because of the complexity of the knee. Tests to make a diagnosis may include:  Your caregiver taking a history and doing a physical exam.   Routine X-rays can be used to rule out other problems. X-rays will not reveal a cartilage tear. Some injuries of the knee can be diagnosed by:   Arthroscopy a surgical technique by which a small video camera is inserted through tiny incisions on the sides of the knee. This procedure is used to examine and repair internal knee joint problems. Tiny instruments can be used during arthroscopy to repair the torn knee cartilage  (meniscus).   Arthrography is a radiology technique. A contrast liquid is directly injected into the knee joint. Internal structures of the knee joint then become visible on X-ray film.   An MRI scan is a non x-ray radiology procedure in which magnetic fields and a computer produce two- or three-dimensional images of the inside of the knee. Cartilage tears are often visible using an MRI scanner. MRI scans have largely replaced arthrography in diagnosing cartilage tears of the knee.   Blood work.   Examination of the fluid that helps to lubricate the knee joint (synovial fluid). This is done by taking a sample out using a needle and a syringe.  TREATMENT The treatment of knee problems depends on the cause. Some of these treatments are:  Depending on the injury, proper casting, splinting, surgery or physical therapy care will be needed.   Give yourself adequate recovery time. Do not overuse your joints. If you begin to get sore during workout routines, back off. Slow down or do fewer repetitions.   For repetitive activities such as cycling or running, maintain your strength and nutrition.   Alternate muscle groups. For example if you are a weight lifter, work the upper body on one day and the lower body the next.   Either tight or weak muscles do not give the proper support for your knee. Tight or weak muscles do not absorb the stress placed   on the knee joint. Keep the muscles surrounding the knee strong.   Take care of mechanical problems.   If you have flat feet, orthotics or special shoes may help. See your caregiver if you need help.   Arch supports, sometimes with wedges on the inner or outer aspect of the heel, can help. These can shift pressure away from the side of the knee most bothered by osteoarthritis.   A brace called an "unloader" brace also may be used to help ease the pressure on the most arthritic side of the knee.   If your caregiver has prescribed crutches, braces,  wraps or ice, use as directed. The acronym for this is PRICE. This means protection, rest, ice, compression and elevation.   Nonsteroidal anti-inflammatory drugs (NSAID's), can help relieve pain. But if taken immediately after an injury, they may actually increase swelling. Take NSAID's with food in your stomach. Stop them if you develop stomach problems. Do not take these if you have a history of ulcers, stomach pain or bleeding from the bowel. Do not take without your caregiver's approval if you have problems with fluid retention, heart failure, or kidney problems.   For ongoing knee problems, physical therapy may be helpful.   Glucosamine and chondroitin are over-the-counter dietary supplements. Both may help relieve the pain of osteoarthritis in the knee. These medicines are different from the usual anti-inflammatory drugs. Glucosamine may decrease the rate of cartilage destruction.   Injections of a corticosteroid drug into your knee joint may help reduce the symptoms of an arthritis flare-up. They may provide pain relief that lasts a few months. You may have to wait a few months between injections. The injections do have a small increased risk of infection, water retention and elevated blood sugar levels.   Hyaluronic acid injected into damaged joints may ease pain and provide lubrication. These injections may work by reducing inflammation. A series of shots may give relief for as long as 6 months.   Topical painkillers. Applying certain ointments to your skin may help relieve the pain and stiffness of osteoarthritis. Ask your pharmacist for suggestions. Many over the-counter products are approved for temporary relief of arthritis pain.   In some countries, doctors often prescribe topical NSAID's for relief of chronic conditions such as arthritis and tendinitis. A review of treatment with NSAID creams found that they worked as well as oral medications but without the serious side effects.    PREVENTION  Maintain a healthy weight. Extra pounds put more strain on your joints.   Get strong, stay limber. Weak muscles are a common cause of knee injuries. Stretching is important. Include flexibility exercises in your workouts.   Be smart about exercise. If you have osteoarthritis, chronic knee pain or recurring injuries, you may need to change the way you exercise. This does not mean you have to stop being active. If your knees ache after jogging or playing basketball, consider switching to swimming, water aerobics or other low-impact activities, at least for a few days a week. Sometimes limiting high-impact activities will provide relief.   Make sure your shoes fit well. Choose footwear that is right for your sport.   Protect your knees. Use the proper gear for knee-sensitive activities. Use kneepads when playing volleyball or laying carpet. Buckle your seat belt every time you drive. Most shattered kneecaps occur in car accidents.   Rest when you are tired.  SEEK MEDICAL CARE IF:  You have knee pain that is continual and does not   seem to be getting better.  SEEK IMMEDIATE MEDICAL CARE IF:  Your knee joint feels hot to the touch and you have a high fever. MAKE SURE YOU:   Understand these instructions.   Will watch your condition.   Will get help right away if you are not doing well or get worse.  Document Released: 08/30/2007 Document Revised: 10/22/2011 Document Reviewed: 08/30/2007 [employer] Patient Information 2012 [employer]. 

## 2012-07-10 ENCOUNTER — Encounter (HOSPITAL_COMMUNITY): Payer: Self-pay | Admitting: Emergency Medicine

## 2012-07-10 ENCOUNTER — Emergency Department (HOSPITAL_COMMUNITY)
Admission: EM | Admit: 2012-07-10 | Discharge: 2012-07-10 | Disposition: A | Discharge status: Home / Self Care | Payer: Self-pay | Attending: Emergency Medicine | Admitting: Emergency Medicine

## 2012-07-10 DIAGNOSIS — M109 Gout, unspecified: Secondary | ICD-10-CM | POA: Insufficient documentation

## 2012-07-10 DIAGNOSIS — L600 Ingrowing nail: Secondary | ICD-10-CM | POA: Insufficient documentation

## 2012-07-10 DIAGNOSIS — K219 Gastro-esophageal reflux disease without esophagitis: Secondary | ICD-10-CM | POA: Insufficient documentation

## 2012-07-10 DIAGNOSIS — E785 Hyperlipidemia, unspecified: Secondary | ICD-10-CM | POA: Insufficient documentation

## 2012-07-10 HISTORY — DX: Gout, unspecified: M10.9

## 2012-07-10 MED ORDER — CEPHALEXIN 500 MG PO CAPS: 500.0000 mg | ORAL_CAPSULE | Freq: Two times a day (BID) | ORAL | Status: AC

## 2012-07-10 MED ORDER — LIDOCAINE HCL (PF) 1 % IJ SOLN: 5.0000 mL | Freq: Once | INTRAMUSCULAR | Status: DC

## 2012-07-10 MED ORDER — TRAMADOL HCL 50 MG PO TABS: 50.0000 mg | ORAL_TABLET | Freq: Four times a day (QID) | ORAL | Status: AC | PRN

## 2012-07-10 NOTE — ED Provider Notes (Signed)
History     CSN: [id number]  Arrival date & time 07/10/12  0228   First MD Initiated Contact with Patient 07/10/12 [phone]      Chief Complaint  Patient presents with  . Toe Pain    (Consider location/radiation/quality/duration/timing/severity/associated sxs/prior treatment) HPI Pt reports several days of medial surface of L great toe pain. Pt has had multiple ingrown toenails and states it feel as though this has recurred. No fever, chill, redness, or swelling Past Medical History  Diagnosis Date  . Hyperlipidemia   . Acid reflux   . Hiatal hernia   . Gout     Past Surgical History  Procedure Date  . Colonoscopy     Family History  Problem Relation Age of Onset  . Pancreatic cancer Father     History  Substance Use Topics  . Smoking status: Never Smoker   . Smokeless tobacco: Not on file  . Alcohol Use: No      Review of Systems  Constitutional: Negative for fever and chills.  Skin: Negative for pallor, rash and wound.  Neurological: Negative for weakness and numbness.    Allergies  Lipitor and Zocor  Home Medications   Current Outpatient Rx  Name Route Sig Dispense Refill  . OMEPRAZOLE 20 MG PO CPDR Oral Take 20 mg by mouth daily. For heart burn    . CEPHALEXIN 500 MG PO CAPS Oral Take 1 capsule (500 mg total) by mouth 2 (two) times daily. 20 capsule 0  . TRAMADOL HCL 50 MG PO TABS Oral Take 1 tablet (50 mg total) by mouth every 6 (six) hours as needed for pain. 15 tablet 0    BP 133/70  Pulse 62  Temp 98.1 F (36.7 C) (Oral)  Resp 18  SpO2 98%  Physical Exam  Nursing note and vitals reviewed. Constitutional: He is oriented to person, place, and time. He appears well-developed and well-nourished. No distress.  HENT:  Head: Normocephalic and atraumatic.  Mouth/Throat: Oropharynx is clear and moist.  Eyes: EOM are normal. Pupils are equal, round, and reactive to light.  Neck: Normal range of motion. Neck supple.  Cardiovascular: Normal rate  and regular rhythm.   Pulmonary/Chest: Effort normal and breath sounds normal.  Abdominal: Soft. Bowel sounds are normal.  Musculoskeletal: Normal range of motion. He exhibits tenderness (TTP over lateral edge of nail of great toe on R. No definite mass or purulence. Nail is thickened). He exhibits no edema.  Neurological: He is alert and oriented to person, place, and time.  Skin: Skin is warm and dry. No rash noted. No erythema.  Psychiatric: He has a normal mood and affect. His behavior is normal.    ED Course  NAIL REMOVAL Date/Time: 07/10/2012 7:00 AM Performed by: Loren Racer Authorized by: Ranae Palms, Serinity Ware Consent: Verbal consent obtained. Patient identity confirmed: verbally with patient Location: left foot Anesthesia: digital block Local anesthetic: lidocaine 1% without epinephrine Anesthetic total: 5 ml Patient sedated: no Preparation: skin prepped with Betadine Amount removed: 1/5 Dressing: gauze roll Patient tolerance: Patient tolerated the procedure well with no immediate complications.   (including critical care time)  Labs Reviewed - No data to display No results found.   1. Nail, ingrown       MDM  D/c home with Abx, ortho boot, and pain control. Advised to return for worsening symptoms      Loren Racer, MD 07/10/12 [phone]

## 2012-07-10 NOTE — ED Notes (Signed)
Patient complaining of pain and swelling in his left big toe; reports bleeding around nail bed site.  No active bleeding at this time.  Patient does report history of gout, but states that this feels different.

## 2012-07-16 ENCOUNTER — Emergency Department (INDEPENDENT_AMBULATORY_CARE_PROVIDER_SITE_OTHER)
Admission: EM | Admit: 2012-07-16 | Discharge: 2012-07-16 | Disposition: A | Discharge status: Home / Self Care | Payer: Self-pay | Source: Home / Self Care | Attending: Family Medicine | Admitting: Family Medicine

## 2012-07-16 ENCOUNTER — Encounter (HOSPITAL_COMMUNITY): Payer: Self-pay | Admitting: *Deleted

## 2012-07-16 ENCOUNTER — Emergency Department (INDEPENDENT_AMBULATORY_CARE_PROVIDER_SITE_OTHER): Payer: Self-pay

## 2012-07-16 DIAGNOSIS — M109 Gout, unspecified: Secondary | ICD-10-CM

## 2012-07-16 MED ORDER — COLCHICINE 0.6 MG PO TABS: 0.6000 mg | ORAL_TABLET | Freq: Two times a day (BID) | ORAL | Status: DC

## 2012-07-16 MED ORDER — DICLOFENAC POTASSIUM 50 MG PO TABS: 50.0000 mg | ORAL_TABLET | Freq: Three times a day (TID) | ORAL | Status: DC

## 2012-07-16 NOTE — ED Provider Notes (Signed)
History     CSN: [id number]  Arrival date & time 07/16/12  1629   First MD Initiated Contact with Patient 07/16/12 1809      Chief Complaint  Patient presents with  . Hand Pain    (Consider location/radiation/quality/duration/timing/severity/associated sxs/prior treatment) Patient is a 56 y.o. male presenting with hand pain. The history is provided by the patient.  Hand Pain This is a new problem. The current episode started yesterday. The problem has been gradually worsening.    Past Medical History  Diagnosis Date  . Hyperlipidemia   . Acid reflux   . Hiatal hernia   . Gout     Past Surgical History  Procedure Date  . Colonoscopy     Family History  Problem Relation Age of Onset  . Pancreatic cancer Father     History  Substance Use Topics  . Smoking status: Never Smoker   . Smokeless tobacco: Not on file  . Alcohol Use: No      Review of Systems  Constitutional: Negative.   Musculoskeletal: Positive for joint swelling.    Allergies  Lipitor and Zocor  Home Medications   Current Outpatient Rx  Name Route Sig Dispense Refill  . OMEPRAZOLE 20 MG PO CPDR Oral Take 20 mg by mouth daily. For heart burn    . CEPHALEXIN 500 MG PO CAPS Oral Take 1 capsule (500 mg total) by mouth 2 (two) times daily. 20 capsule 0  . COLCHICINE 0.6 MG PO TABS Oral Take 1 tablet (0.6 mg total) by mouth 2 (two) times daily. 20 tablet 1  . DICLOFENAC POTASSIUM 50 MG PO TABS Oral Take 1 tablet (50 mg total) by mouth 3 (three) times daily. 30 tablet 0  . TRAMADOL HCL 50 MG PO TABS Oral Take 1 tablet (50 mg total) by mouth every 6 (six) hours as needed for pain. 15 tablet 0    BP 149/88  Pulse 59  Temp 98.6 F (37 C) (Oral)  Resp 16  SpO2 100%  Physical Exam  Nursing note and vitals reviewed. Constitutional: He appears well-developed and well-nourished.  Musculoskeletal: He exhibits tenderness.       Arms:      Left hand: He exhibits tenderness. normal sensation noted.  Decreased strength noted.  Skin: Skin is warm and dry.    ED Course  Procedures (including critical care time)  Labs Reviewed - No data to display Dg Hand Complete Left  07/16/2012  *RADIOLOGY REPORT*  Clinical Data: Pain and swelling for 2 days, no known injury  LEFT HAND - COMPLETE 3+ VIEW  Comparison: None  Findings: Osseous mineralization normal. Joint spaces preserved. Question minimal spurring at third metacarpal head. Diffuse soft tissue swelling. No acute fracture, dislocation, or bone destruction.  IMPRESSION: No acute osseous abnormalities.   Original Report Authenticated By: Lollie Marrow, M.D.      1. Gout flare       MDM  X-rays reviewed and report per radiologist.         Linna Hoff, MD 07/16/12 [phone]

## 2012-07-16 NOTE — ED Notes (Signed)
Pt with pain and swelling left hand onset yesterday - denies injury - pain increases when making a fist

## 2012-09-24 ENCOUNTER — Encounter (HOSPITAL_COMMUNITY): Payer: Self-pay | Admitting: Emergency Medicine

## 2012-09-24 ENCOUNTER — Emergency Department (HOSPITAL_COMMUNITY)
Admission: EM | Admit: 2012-09-24 | Discharge: 2012-09-24 | Disposition: A | Discharge status: Home / Self Care | Payer: Self-pay | Attending: Emergency Medicine | Admitting: Emergency Medicine

## 2012-09-24 DIAGNOSIS — M79603 Pain in arm, unspecified: Secondary | ICD-10-CM

## 2012-09-24 DIAGNOSIS — K449 Diaphragmatic hernia without obstruction or gangrene: Secondary | ICD-10-CM | POA: Insufficient documentation

## 2012-09-24 DIAGNOSIS — M79609 Pain in unspecified limb: Secondary | ICD-10-CM | POA: Insufficient documentation

## 2012-09-24 DIAGNOSIS — Z79899 Other long term (current) drug therapy: Secondary | ICD-10-CM | POA: Insufficient documentation

## 2012-09-24 DIAGNOSIS — E785 Hyperlipidemia, unspecified: Secondary | ICD-10-CM | POA: Insufficient documentation

## 2012-09-24 DIAGNOSIS — K219 Gastro-esophageal reflux disease without esophagitis: Secondary | ICD-10-CM | POA: Insufficient documentation

## 2012-09-24 DIAGNOSIS — M109 Gout, unspecified: Secondary | ICD-10-CM | POA: Insufficient documentation

## 2012-09-24 DIAGNOSIS — Z791 Long term (current) use of non-steroidal anti-inflammatories (NSAID): Secondary | ICD-10-CM | POA: Insufficient documentation

## 2012-09-24 MED ORDER — HYDROCODONE-ACETAMINOPHEN 5-325 MG PO TABS: ORAL_TABLET | ORAL | Status: DC

## 2012-09-24 MED ORDER — INDOMETHACIN 25 MG PO CAPS: 50.0000 mg | ORAL_CAPSULE | Freq: Three times a day (TID) | ORAL | Status: DC | PRN

## 2012-09-24 NOTE — ED Provider Notes (Signed)
History     CSN: [id number]  Arrival date & time 09/24/12  0442   First MD Initiated Contact with Patient 09/24/12 0602      Chief Complaint  Patient presents with  . Arm Pain    (Consider location/radiation/quality/duration/timing/severity/associated sxs/prior treatment) HPI Comments: Patient presents with complaint of right elbow and wrist pain. Patient has a history of gout and states that he had a flare approximately 2 weeks ago and has not quite resolved. This morning he awoke with pain in his right elbow which was worse. He states that he is having trouble fully flexing his elbow. Patient denies injuries. No treatments prior to arrival. No fevers, nausea or vomiting. Patient does not have any history of diabetes or recent instrumentation or injections of the joint. Denies redness or warmth in the arm. No weakness, numbness, or tingling in his right arm or hand. Onset was acute. Course is constant. Movement and palpation makes the pain worse. Patient had been prescribed daily colchicine in the past but no longer takes this.   Patient is a 56 y.o. male presenting with arm pain.  Arm Pain Associated symptoms include arthralgias. Pertinent negatives include no joint swelling, neck pain, numbness or weakness.    Past Medical History  Diagnosis Date  . Hyperlipidemia   . Acid reflux   . Hiatal hernia   . Gout     Past Surgical History  Procedure Date  . Colonoscopy     Family History  Problem Relation Age of Onset  . Pancreatic cancer Father     History  Substance Use Topics  . Smoking status: Never Smoker   . Smokeless tobacco: Not on file  . Alcohol Use: No      Review of Systems  Constitutional: Negative for activity change.  HENT: Negative for neck pain.   Musculoskeletal: Positive for arthralgias. Negative for back pain, joint swelling and gait problem.  Skin: Negative for wound.  Neurological: Negative for weakness and numbness.    Allergies  Lipitor  and Zocor  Home Medications   Current Outpatient Rx  Name  Route  Sig  Dispense  Refill  . OMEPRAZOLE 20 MG PO CPDR   Oral   Take 20 mg by mouth daily. For heart burn         . HYDROCODONE-ACETAMINOPHEN 5-325 MG PO TABS      Take 1-2 tablets every 6 hours as needed for severe pain   12 tablet   0   . INDOMETHACIN 25 MG PO CAPS   Oral   Take 2 capsules (50 mg total) by mouth 3 (three) times daily as needed.   30 capsule   0     BP 125/64  Pulse 65  Temp 97.9 F (36.6 C) (Oral)  Resp 18  SpO2 100%  Physical Exam  Nursing note and vitals reviewed. Constitutional: He appears well-developed and well-nourished.  HENT:  Head: Normocephalic and atraumatic.  Eyes: Conjunctivae normal are normal.  Neck: Normal range of motion. Neck supple.  Cardiovascular: Normal pulses.   Pulses:      Radial pulses are 2+ on the right side, and 2+ on the left side.  Musculoskeletal: He exhibits tenderness. He exhibits no edema.       Left shoulder: Normal.       Left elbow: He exhibits decreased range of motion (decreased flexion) and effusion. tenderness (generalized) found.       Left wrist: Normal. He exhibits normal range of motion, no tenderness, no  bony tenderness and no swelling.       Left forearm: He exhibits swelling (trace in soft tissues, no cellulitis). He exhibits no tenderness and no bony tenderness.       Left hand: He exhibits normal range of motion, no tenderness, normal capillary refill and no swelling. Decreased sensation is not present in the ulnar distribution, is not present in the medial redistribution and is not present in the radial distribution. He exhibits no finger abduction, no thumb/finger opposition and no wrist extension trouble.  Neurological: He is alert. No sensory deficit.       Motor, sensation, and vascular distal to the injury is fully intact.   Skin: Skin is warm and dry.  Psychiatric: He has a normal mood and affect.    ED Course  Procedures  (including critical care time)  Labs Reviewed - No data to display No results found.   1. Arm pain    6:26 AM Patient seen and examined. Pt drive so will be provided prescriptions. Patient does not want steroids.    Vital signs reviewed and are as follows: Filed Vitals:   09/24/12 0450  BP: 125/64  Pulse: 65  Temp: 97.9 F (36.6 C)  Resp: 18   Counseled on gout diet. Patient urged to followup with orthopedic physician if not improved in a week. Urged to return with worsening or other concerns. Patient verbalizes understanding and agrees with the plan.  Patient counseled on use of narcotic pain medications. Counseled not to combine these medications with others containing tylenol. Urged not to drink alcohol, drive, or perform any other activities that requires focus while taking these medications. The patient verbalizes understanding and agrees with the plan.   MDM  Patient with elbow pain and mild arm swelling. Compartments are soft. 2+ radial pulses bilaterally. No erythema or warmth to suggest infection. Doubt septic arthritis given lack of risk factors and presentation. Patient appears well and non-toxic.         [place], [place] 09/24/12 [phone]

## 2012-09-24 NOTE — ED Provider Notes (Signed)
Medical screening examination/treatment/procedure(s) were performed by non-physician practitioner and as supervising physician I was immediately available for consultation/collaboration.  Olivia Mackie, MD 09/24/12 [phone]

## 2012-09-24 NOTE — ED Notes (Signed)
Pt complains of burning and pain in right arm from right elbow to right hand.  Pt states he had an attack of gout approximately two weeks ago.  Appears swollen and tender to touch on pt. Right hand. Pt stable at this time.

## 2012-11-05 ENCOUNTER — Emergency Department (HOSPITAL_COMMUNITY)
Admission: EM | Admit: 2012-11-05 | Discharge: 2012-11-05 | Disposition: A | Discharge status: Home / Self Care | Payer: Self-pay | Attending: Emergency Medicine | Admitting: Emergency Medicine

## 2012-11-05 ENCOUNTER — Emergency Department (HOSPITAL_COMMUNITY): Payer: Self-pay

## 2012-11-05 DIAGNOSIS — Z79899 Other long term (current) drug therapy: Secondary | ICD-10-CM | POA: Insufficient documentation

## 2012-11-05 DIAGNOSIS — M79676 Pain in unspecified toe(s): Secondary | ICD-10-CM

## 2012-11-05 DIAGNOSIS — K219 Gastro-esophageal reflux disease without esophagitis: Secondary | ICD-10-CM | POA: Insufficient documentation

## 2012-11-05 DIAGNOSIS — M7989 Other specified soft tissue disorders: Secondary | ICD-10-CM | POA: Insufficient documentation

## 2012-11-05 DIAGNOSIS — M109 Gout, unspecified: Secondary | ICD-10-CM | POA: Insufficient documentation

## 2012-11-05 DIAGNOSIS — M79609 Pain in unspecified limb: Secondary | ICD-10-CM | POA: Insufficient documentation

## 2012-11-05 DIAGNOSIS — Z8719 Personal history of other diseases of the digestive system: Secondary | ICD-10-CM | POA: Insufficient documentation

## 2012-11-05 DIAGNOSIS — E785 Hyperlipidemia, unspecified: Secondary | ICD-10-CM | POA: Insufficient documentation

## 2012-11-05 MED ORDER — HYDROCODONE-ACETAMINOPHEN 5-325 MG PO TABS: 1.0000 | ORAL_TABLET | ORAL | Status: DC | PRN

## 2012-11-05 MED ORDER — INDOMETHACIN 25 MG PO CAPS: 25.0000 mg | ORAL_CAPSULE | Freq: Three times a day (TID) | ORAL | Status: DC | PRN

## 2012-11-05 NOTE — ED Provider Notes (Signed)
Medical screening examination/treatment/procedure(s) were performed by non-physician practitioner and as supervising physician I was immediately available for consultation/collaboration.   Lyanne Co, MD 11/05/12 [phone]

## 2012-11-05 NOTE — ED Notes (Addendum)
Pt went to urgent care 3 years ago and they told him he had a hairline fx in his right great toe and down into his foot. Pt was also told he had "thin Bones". Pt felt pain in same area aprox 3 weeks ago and the pain has become increasingly worse since last night. Pt rates pain at a 7 out of 10.

## 2012-11-05 NOTE — ED Notes (Signed)
Pt is driving and declined pain medication to be given here. Would prefer to take some when he gets home because he can not call anyone for a ride.

## 2012-11-05 NOTE — ED Provider Notes (Signed)
History     CSN: [id number]  Arrival date & time 11/05/12  0905   First MD Initiated Contact with Patient 11/05/12 0932      Chief Complaint  Patient presents with  . Foot Injury    (Consider location/radiation/quality/duration/timing/severity/associated sxs/prior treatment) HPI Jerry Larson is a 56 y.o. male who presents with complaint of pain to the right great toe. Pt states pain started about 3 wks ago. Not improving. States hx of gout, but this does not feel the same. States also hx of hairline fracture. No new recent injuries. No medications taken at home. No fever, chills, malaise.    Past Medical History  Diagnosis Date  . Hyperlipidemia   . Acid reflux   . Hiatal hernia   . Gout     Past Surgical History  Procedure Date  . Colonoscopy     Family History  Problem Relation Age of Onset  . Pancreatic cancer Father     History  Substance Use Topics  . Smoking status: Never Smoker   . Smokeless tobacco: Not on file  . Alcohol Use: No      Review of Systems  Constitutional: Negative for fever and chills.  Respiratory: Negative.   Cardiovascular: Negative.   Musculoskeletal: Positive for joint swelling.  Skin: Positive for color change.  Neurological: Negative for weakness and numbness.    Allergies  Lipitor and Zocor  Home Medications   Current Outpatient Rx  Name  Route  Sig  Dispense  Refill  . HYDROCODONE-ACETAMINOPHEN 5-325 MG PO TABS      Take 1-2 tablets every 6 hours as needed for severe pain   12 tablet   0   . INDOMETHACIN 25 MG PO CAPS   Oral   Take 2 capsules (50 mg total) by mouth 3 (three) times daily as needed.   30 capsule   0   . OMEPRAZOLE 20 MG PO CPDR   Oral   Take 20 mg by mouth daily. For heart burn           BP 136/75  Pulse 60  Resp 14  SpO2 97%  Physical Exam  Nursing note and vitals reviewed. Constitutional: He is oriented to person, place, and time. He appears well-developed and well-nourished. No  distress.  Eyes: Conjunctivae normal are normal.  Cardiovascular: Normal rate, regular rhythm and normal heart sounds.   Pulmonary/Chest: Effort normal and breath sounds normal. No respiratory distress. He has no wheezes. He has no rales.  Musculoskeletal:       Mild swelling and erythema noted over right MTP joint of the great toe. Tender to palpation over the joint. Pain with ROM at MTP joint. Full rom. Normal distal toe and normal foot exam with no tenderness, swelling, discoloration.  Neurological: He is alert and oriented to person, place, and time.  Skin: Skin is warm and dry.    ED Course  Procedures (including critical care time)  Labs Reviewed - No data to display Dg Foot Complete Right  11/05/2012  *RADIOLOGY REPORT*  Clinical Data: Pain for 3 weeks, no injury  RIGHT FOOT COMPLETE - 3+ VIEW  Comparison: Right foot films of 06/08/2018  Findings: There is mild degenerative change at the right first MTP joint with some loss of joint space and spurring.  There is adjacent soft tissue swelling.  Is there history of gout?  Tarsal - metatarsal alignment is normal.  A calcaneal spur is noted at the insertion of the Achilles tendon.  IMPRESSION:  Mild degenerative change at the right first MTP joint.  No definite erosion.  Is there any clinical history of gout?   Original Report Authenticated By: Dwyane Dee, M.D.      1. Pain of great toe       MDM  PT with non traumatic right MTP joint pain. Hx and exam, as well as x-ray most consistent with gout, which pt has hx of, however, pt does not feel like this is the same pain. Possibly also arthritic pain. No signs of infection. No injury, no fractures on x-ray. Will treat with indocin, vicodin, follow up as needed with PCP.        Lottie Mussel, PA 11/05/12 1625

## 2013-08-27 ENCOUNTER — Emergency Department (HOSPITAL_COMMUNITY): Payer: BC Managed Care – PPO

## 2013-08-27 ENCOUNTER — Emergency Department (HOSPITAL_COMMUNITY)
Admission: EM | Admit: 2013-08-27 | Discharge: 2013-08-27 | Disposition: A | Discharge status: Home / Self Care | Payer: BC Managed Care – PPO | Attending: Emergency Medicine | Admitting: Emergency Medicine

## 2013-08-27 ENCOUNTER — Encounter (HOSPITAL_COMMUNITY): Payer: Self-pay | Admitting: Emergency Medicine

## 2013-08-27 DIAGNOSIS — Z862 Personal history of diseases of the blood and blood-forming organs and certain disorders involving the immune mechanism: Secondary | ICD-10-CM | POA: Insufficient documentation

## 2013-08-27 DIAGNOSIS — K219 Gastro-esophageal reflux disease without esophagitis: Secondary | ICD-10-CM | POA: Insufficient documentation

## 2013-08-27 DIAGNOSIS — Z79899 Other long term (current) drug therapy: Secondary | ICD-10-CM | POA: Insufficient documentation

## 2013-08-27 DIAGNOSIS — M109 Gout, unspecified: Secondary | ICD-10-CM | POA: Insufficient documentation

## 2013-08-27 DIAGNOSIS — R0789 Other chest pain: Secondary | ICD-10-CM | POA: Insufficient documentation

## 2013-08-27 DIAGNOSIS — Z8639 Personal history of other endocrine, nutritional and metabolic disease: Secondary | ICD-10-CM | POA: Insufficient documentation

## 2013-08-27 LAB — CBC WITH DIFFERENTIAL/PLATELET
Eosinophils Relative: 3 % (ref 0–5)
HCT: 43.5 % (ref 39.0–52.0)
Lymphocytes Relative: 38 % (ref 12–46)
Lymphs Abs: 2.8 10*3/uL (ref 0.7–4.0)
MCV: 87.9 fL (ref 78.0–100.0)
Monocytes Absolute: 0.7 10*3/uL (ref 0.1–1.0)
Monocytes Relative: 9 % (ref 3–12)
RBC: 4.95 MIL/uL (ref 4.22–5.81)
WBC: 7.4 10*3/uL (ref 4.0–10.5)

## 2013-08-27 LAB — COMPREHENSIVE METABOLIC PANEL
ALT: 20 U/L (ref 0–53)
CO2: 28 mEq/L (ref 19–32)
Calcium: 9.5 mg/dL (ref 8.4–10.5)
Creatinine, Ser: 1.17 mg/dL (ref 0.50–1.35)
GFR calc Af Amer: 77 mL/min — ABNORMAL LOW (ref >90)
GFR calc non Af Amer: 67 mL/min — ABNORMAL LOW (ref >90)
Glucose, Bld: 100 mg/dL — ABNORMAL HIGH (ref 70–99)

## 2013-08-27 LAB — POCT I-STAT TROPONIN I: Troponin i, poc: 0.02 ng/mL (ref 0.00–0.08)

## 2013-08-27 NOTE — ED Notes (Signed)
Pt c/o l side chest pain and SOB x15 mins.  Denies cardiac hx.  Reports SOB ad palpitations.

## 2013-08-27 NOTE — ED Provider Notes (Addendum)
CSN: [id number]     Arrival date & time 08/27/13  1118 History   First MD Initiated Contact with Patient 08/27/13 1145     Chief Complaint  Patient presents with  . Chest Pain   (Consider location/radiation/quality/duration/timing/severity/associated sxs/prior Treatment) Patient is a 57 y.o. male presenting with chest pain. The history is provided by the patient.  Chest Pain Pain location:  L chest Pain quality: sharp and tightness   Pain radiates to:  Does not radiate Pain radiates to the back: no   Pain severity:  Moderate Onset quality:  Sudden Duration:  30 minutes Timing:  Constant Progression:  Resolved Chronicity:  Recurrent Context comment:  States that he as at church and developed a sharp pain in the left chest and sensation that his heart was beating strong.  States he has had this for months but seemed worse today Relieved by:  Nothing Worsened by:  Nothing tried Ineffective treatments:  None tried Associated symptoms: no abdominal pain, no cough, no diaphoresis, no fever, no nausea, no orthopnea, no shortness of breath, no syncope, not vomiting and no weakness   Associated symptoms comment:  Pt states his SOB has been improving from what it used to be Risk factors: high cholesterol and male sex   Risk factors: no coronary artery disease, no diabetes mellitus, no hypertension, no prior DVT/PE, no smoking and no surgery   Risk factors comment:  Hx of hiatal hernia   Past Medical History  Diagnosis Date  . Hyperlipidemia   . Acid reflux   . Hiatal hernia   . Gout    Past Surgical History  Procedure Laterality Date  . Colonoscopy     Family History  Problem Relation Age of Onset  . Pancreatic cancer Father    History  Substance Use Topics  . Smoking status: Never Smoker   . Smokeless tobacco: Not on file  . Alcohol Use: No    Review of Systems  Constitutional: Negative for fever and diaphoresis.  Respiratory: Negative for cough and shortness of breath.    Cardiovascular: Positive for chest pain. Negative for orthopnea and syncope.  Gastrointestinal: Negative for nausea, vomiting and abdominal pain.  Neurological: Negative for weakness.  All other systems reviewed and are negative.    Allergies  Lipitor and Zocor  Home Medications   Current Outpatient Rx  Name  Route  Sig  Dispense  Refill  . furosemide (LASIX) 20 MG tablet   Oral   Take 20 mg by mouth daily.         . indomethacin (INDOCIN) 25 MG capsule   Oral   Take 2 capsules (50 mg total) by mouth 3 (three) times daily as needed.   30 capsule   0   . omeprazole (PRILOSEC) 20 MG capsule   Oral   Take 20 mg by mouth daily. For heart burn          BP 157/82  Pulse 57  Temp(Src) 98.3 F (36.8 C) (Oral)  Resp 21  SpO2 97% Physical Exam  Nursing note and vitals reviewed. Constitutional: He is oriented to person, place, and time. He appears well-developed and well-nourished. No distress.  HENT:  Head: Normocephalic and atraumatic.  Mouth/Throat: Oropharynx is clear and moist.  Eyes: Conjunctivae and EOM are normal. Pupils are equal, round, and reactive to light.  Neck: Normal range of motion. Neck supple.  Cardiovascular: Normal rate, regular rhythm and intact distal pulses.   No murmur heard. Pulmonary/Chest: Effort normal and breath  sounds normal. No respiratory distress. He has no wheezes. He has no rales. He exhibits no tenderness.  Abdominal: Soft. He exhibits no distension. There is no tenderness. There is no rebound and no guarding.  Protuberant abd  Musculoskeletal: Normal range of motion. He exhibits no edema and no tenderness.  Neurological: He is alert and oriented to person, place, and time.  Skin: Skin is warm and dry. No rash noted. No erythema.  Psychiatric: He has a normal mood and affect. His behavior is normal.    ED Course  Procedures (including critical care time) Labs Review Labs Reviewed  COMPREHENSIVE METABOLIC PANEL - Abnormal;  Notable for the following:    Glucose, Bld 100 (*)    GFR calc non Af Amer 67 (*)    GFR calc Af Amer 77 (*)    All other components within normal limits  CBC WITH DIFFERENTIAL  POCT I-STAT TROPONIN I  POCT I-STAT TROPONIN I   Imaging Review Dg Chest 2 View  08/27/2013   CLINICAL DATA:  Chest pain.  EXAM: CHEST  2 VIEW  COMPARISON:  CHEST x-ray 01/28/2012.  FINDINGS: Lung volumes are normal. No consolidative airspace disease. No pleural effusions. No pneumothorax. No pulmonary nodule or mass noted. Pulmonary vasculature and the cardiomediastinal silhouette are within normal limits.  IMPRESSION: 1.  No radiographic evidence of acute cardiopulmonary disease.   Electronically Signed   By: Trudie Reed M.D.   On: 08/27/2013 12:41    EKG Interpretation   None      Date: 08/27/2013  Rate: 54  Rhythm: normal sinus rhythm  QRS Axis: normal  Intervals: normal  ST/T Wave abnormalities: normal  Conduction Disutrbances:none  Narrative Interpretation:   Old EKG Reviewed: unchanged    MDM   1. Atypical chest pain     Pt with atypical story for CP that started while at church today and felt his heart beating strong but no skipped beats or racing.  TIMI 0 and only 1 risk factor which is hyperlipidemia.  Low risk wells and sx do not seem to be PE.  Pt has been evaluated in the past about 1 year ago for the same pain and neg work up.  He states he has been getting the pain for "awhile."  EKG unchanged.  CXR, CBC, BMP, CE (0,19min) pending.  12:38 PM All initial labs wnl.  Will get delta troponin.  Feel most likely related to his hiatal hernia however will give f/u with cardiology for outpt work up.   2:57 PM Delta troponin neg.  Will f/u with PcP for stress.  Gwyneth Sprout, MD 08/27/13 1457  Gwyneth Sprout, MD 08/27/13 1459

## 2013-08-27 NOTE — ED Notes (Signed)
Patient transported to X-ray 

## 2013-09-01 ENCOUNTER — Ambulatory Visit (INDEPENDENT_AMBULATORY_CARE_PROVIDER_SITE_OTHER): Payer: BC Managed Care – PPO | Admitting: Cardiology

## 2013-09-01 ENCOUNTER — Encounter: Payer: Self-pay | Admitting: Cardiology

## 2013-09-01 VITALS — BP 120/80 | HR 72 | Ht 65.0 in | Wt 277.0 lb

## 2013-09-01 DIAGNOSIS — R079 Chest pain, unspecified: Secondary | ICD-10-CM | POA: Insufficient documentation

## 2013-09-01 DIAGNOSIS — R002 Palpitations: Secondary | ICD-10-CM

## 2013-09-01 DIAGNOSIS — E785 Hyperlipidemia, unspecified: Secondary | ICD-10-CM | POA: Insufficient documentation

## 2013-09-01 NOTE — Progress Notes (Signed)
[address] [place], [place]  [address] Phone: [phone] Fax:  [phone]  Date:  09/01/2013   ID:  Jerry Larson, DOB July 04, 1956, MRN [id number]  PCP:  Default, Provider, MD  Cardiologist:  Armanda Magic, MD   History of Present Illness: Jerry Larson is a 57 y.o. male with a history of GERD who presents for evaluation of CP.  He has had CP since the 1980's from GERD and had a remote ETT in the past.  This past Sunday he developed CP while sitting a church.  He described it as feeling his heart beating through his clothes and skipped beats.  He also had pain which he described as a pressure 4/10.  He went to [facility] ER where his workup was normal.  He thinks the pain lasted 30 minutes.  The CP occurs every other day a few times a day.  He felt some SOB in the ER.  He denies any nausea or diaphoresis.     Wt Readings from Last 3 Encounters:  09/01/13 27 lb (12.247 kg)  03/04/11 223 lb (101.152 kg)  02/18/11 223 lb (101.152 kg)     Past Medical History  Diagnosis Date  . Hyperlipidemia   . Acid reflux   . Hiatal hernia   . Gout   . DDD (degenerative disc disease)   . Obesity   . Osteopenia     Current Outpatient Prescriptions  Medication Sig Dispense Refill  . furosemide (LASIX) 20 MG tablet Take 20 mg by mouth daily.      . indomethacin (INDOCIN) 25 MG capsule Take 2 capsules (50 mg total) by mouth 3 (three) times daily as needed.  30 capsule  0  . omeprazole (PRILOSEC) 20 MG capsule Take 20 mg by mouth daily. For heart burn      . [DISCONTINUED] famotidine (PEPCID AC) 10 MG chewable tablet Chew 10 mg by mouth 2 (two) times daily.       No current facility-administered medications for this visit.    Allergies:    Allergies  Allergen Reactions  . Lipitor [Atorvastatin Calcium] Other (See Comments)    Caused joint aches  . Zocor [Simvastatin] Other (See Comments)    Interfered with blood count    Social History:  The patient  reports that he has never  smoked. He does not have any smokeless tobacco history on file. He reports that he does not drink alcohol or use illicit drugs.   Family History:  The patient's family history includes Hypertension in his mother and sister; Pancreatic cancer in his father.   ROS:  Please see the history of present illness.      All other systems reviewed and negative.   PHYSICAL EXAM: VS:  BP 120/80  Pulse 72  Ht 5\' 5"  (1.651 m)  Wt 27 lb (12.247 kg)  BMI 4.49 kg/m2 Well nourished, well developed, in no acute distress HEENT: normal Neck: no JVD Cardiac:  normal S1, S2; RRR; no murmur Lungs:  clear to auscultation bilaterally, no wheezing, rhonchi or rales Abd: soft, nontender, no hepatomegaly Ext: no edema Skin: warm and dry Neuro:  CNs 2-12 intact, no focal abnormalities noted  EKG:     NSR with rSR'  ASSESSMENT AND PLAN:  1. Chest pain with a history of GERD.  CRF include age >60, male sex, obesity.  - I will obtain a stress myoview to rule out ischemia  - 2D echo to assess LVF, rule out pericardial effusion 2.  Palpitations  - event monitor to assess for arrhythmias  Followup with me in 4 weeks  Signed, Armanda Magic, MD 09/01/2013 1:49 PM

## 2013-09-01 NOTE — Patient Instructions (Signed)
Your physician has requested that you have an echocardiogram. Echocardiography is a painless test that uses sound waves to create images of your heart. It provides your doctor with information about the size and shape of your heart and how well your heart's chambers and valves are working. This procedure takes approximately one hour. There are no restrictions for this procedure.  Your physician has requested that you have en exercise stress myoview. For further information please visit [email or web address] Please follow instruction sheet, as given.  Your physician has recommended that you wear an event monitor. Event monitors are medical devices that record the heart's electrical activity. Doctors most often [place] these monitors to diagnose arrhythmias. Arrhythmias are problems with the speed or rhythm of the heartbeat. The monitor is a small, portable device. You can wear one while you do your normal daily activities. This is usually used to diagnose what is causing palpitations/syncope (passing out).   Your physician recommends that you schedule a follow-up appointment in: 4 weeks with Dr Mayford Knife

## 2013-09-04 ENCOUNTER — Ambulatory Visit (HOSPITAL_COMMUNITY): Payer: BC Managed Care – PPO | Attending: Cardiology | Admitting: Radiology

## 2013-09-04 VITALS — BP 103/63 | HR 54 | Ht 65.0 in | Wt 226.0 lb

## 2013-09-04 DIAGNOSIS — R079 Chest pain, unspecified: Secondary | ICD-10-CM

## 2013-09-04 DIAGNOSIS — R0602 Shortness of breath: Secondary | ICD-10-CM | POA: Insufficient documentation

## 2013-09-04 DIAGNOSIS — R002 Palpitations: Secondary | ICD-10-CM | POA: Insufficient documentation

## 2013-09-04 DIAGNOSIS — R0789 Other chest pain: Secondary | ICD-10-CM | POA: Insufficient documentation

## 2013-09-04 MED ORDER — TECHNETIUM TC 99M SESTAMIBI GENERIC - CARDIOLITE
10.0000 | Freq: Once | INTRAVENOUS | Status: AC | PRN
  Administered 2013-09-04: 10 via INTRAVENOUS

## 2013-09-04 MED ORDER — TECHNETIUM TC 99M SESTAMIBI GENERIC - CARDIOLITE
30.0000 | Freq: Once | INTRAVENOUS | Status: AC | PRN
  Administered 2013-09-04: 30 via INTRAVENOUS

## 2013-09-04 NOTE — Progress Notes (Signed)
  [facility] SITE 3 NUCLEAR MED [address] [place], [place] [address] [phone]    Cardiology Nuclear Med Study  Jerry Larson is a 57 y.o. male     MRN : [id number]     DOB: 1956/09/01  Procedure Date: 09/04/2013  Nuclear Med Background Indication for Stress Test:  Evaluation for Ischemia and Post Hospital:08/27/13 and 09/02/13 ED for Chest pain/SOB and (-) enzymes History:  GXT in past Cardiac Risk Factors: Lipids  Symptoms:  Chest Pain with Exertion (last date of chest discomfort 09/02/13), Palpitations and Rapid HR   Nuclear Pre-Procedure Caffeine/Decaff Intake:  None NPO After: 7:00pm   Lungs:  clear O2 Sat: 95% on room air. IV 0.9% NS with Angio Cath:  22g  IV Site: R Hand  IV Started by:  Cathlyn Parsons, RN  Chest Size (in):  50 Cup Size: n/a  Height: 5\' 5"  (1.651 m)  Weight:  226 lb (102.513 kg)  BMI:  Body mass index is 37.61 kg/(m^2). Tech Comments:  n/a    Nuclear Med Study 1 or 2 day study: 1 day  Stress Test Type:  Stress  Reading MD: Wyvonna Plum  Order Authorizing Provider:  Gloris Manchester Turner,MD  Resting Radionuclide: Technetium 62m Sestamibi  Resting Radionuclide Dose: 11.0 mCi   Stress Radionuclide:  Technetium 65m Sestamibi  Stress Radionuclide Dose: 33.0 mCi           Stress Protocol Rest HR: 54 Stress HR: 150  Rest BP: 103/63 Stress BP: 167/90  Exercise Time (min): 7:15 METS: 8.80   Predicted Max HR: 161 bpm % Max HR: 93.17 bpm Rate Pressure Product: 22025   Dose of Adenosine (mg):  n/a Dose of Lexiscan: n/a mg  Dose of Atropine (mg): n/a Dose of Dobutamine: n/a mcg/kg/min (at max HR)  Stress Test Technologist: Nelson Chimes, BS-ES  Nuclear Technologist:  Domenic Polite, CNMT     Rest Procedure:  Myocardial perfusion imaging was performed at rest 45 minutes following the intravenous administration of Technetium 44m Sestamibi. Rest ECG: NSR - Normal EKG  Stress Procedure:  The patient exercised on the treadmill  utilizing the Bruce Protocol for 7:15 minutes. The patient stopped due to SOB and denied any chest pain.  Technetium 40m Sestamibi was injected at peak exercise and myocardial perfusion imaging was performed after a brief delay. Stress ECG:  QPS Raw Data Images:  Mild diaphragmatic attenuation.  Normal left ventricular size. Stress Images:  There is decreased uptake in the inferior wall. Rest Images:  There is decreased uptake in the inferior wall. Subtraction (SDS):  No evidence of ischemia. Transient Ischemic Dilatation (Normal <1.22):  0.95 Lung/Heart Ratio (Normal <0.45):  0.37  Quantitative Gated Spect Images QGS EDV:  66 ml QGS ESV:  24 ml  Impression Exercise Capacity:  Excellent exercise capacity. BP Response:  Normal blood pressure response. Clinical Symptoms:  There is dyspnea. ECG Impression:  Insignificant upsloping ST segment depression. Comparison with Prior Nuclear Study: No previous nuclear study performed  Overall Impression:  Low risk stress nuclear study with a fixed defect in the inferior wall c/w diaphragmatic attenuation.  No evidence of inducible ischemia..  LV Ejection Fraction: 63%.  LV Wall Motion:  NL LV Function; NL Wall Motion   Signed, Armanda Magic, MD 09/04/2013 16:22PM

## 2013-09-16 DIAGNOSIS — K5732 Diverticulitis of large intestine without perforation or abscess without bleeding: Secondary | ICD-10-CM

## 2013-09-16 HISTORY — DX: Diverticulitis of large intestine without perforation or abscess without bleeding: K57.32

## 2013-09-18 ENCOUNTER — Ambulatory Visit (HOSPITAL_COMMUNITY): Payer: BC Managed Care – PPO | Attending: Cardiology

## 2013-09-18 ENCOUNTER — Encounter (INDEPENDENT_AMBULATORY_CARE_PROVIDER_SITE_OTHER): Payer: BC Managed Care – PPO

## 2013-09-18 ENCOUNTER — Encounter: Payer: Self-pay | Admitting: *Deleted

## 2013-09-18 DIAGNOSIS — R0989 Other specified symptoms and signs involving the circulatory and respiratory systems: Secondary | ICD-10-CM | POA: Insufficient documentation

## 2013-09-18 DIAGNOSIS — R002 Palpitations: Secondary | ICD-10-CM | POA: Insufficient documentation

## 2013-09-18 DIAGNOSIS — K219 Gastro-esophageal reflux disease without esophagitis: Secondary | ICD-10-CM | POA: Insufficient documentation

## 2013-09-18 DIAGNOSIS — R079 Chest pain, unspecified: Secondary | ICD-10-CM

## 2013-09-18 DIAGNOSIS — E669 Obesity, unspecified: Secondary | ICD-10-CM | POA: Insufficient documentation

## 2013-09-18 DIAGNOSIS — E785 Hyperlipidemia, unspecified: Secondary | ICD-10-CM | POA: Insufficient documentation

## 2013-09-18 DIAGNOSIS — R0609 Other forms of dyspnea: Secondary | ICD-10-CM | POA: Insufficient documentation

## 2013-09-18 DIAGNOSIS — Z6838 Body mass index (BMI) 38.0-38.9, adult: Secondary | ICD-10-CM | POA: Insufficient documentation

## 2013-09-18 DIAGNOSIS — I079 Rheumatic tricuspid valve disease, unspecified: Secondary | ICD-10-CM | POA: Insufficient documentation

## 2013-09-18 NOTE — Progress Notes (Signed)
Patient ID: Jerry Larson, male   DOB: 1956/04/01, 57 y.o.   MRN: [id number] [employer] 30 day cardiac event monitor applied to patient.

## 2013-09-18 NOTE — Progress Notes (Signed)
Echocardiogram performed.  

## 2013-09-19 ENCOUNTER — Telehealth: Payer: Self-pay | Admitting: Cardiology

## 2013-09-19 NOTE — Telephone Encounter (Signed)
Reviewed echo results with patient and answered all questions.

## 2013-09-19 NOTE — Telephone Encounter (Signed)
New problem    Pt has some questions about his test 10/3.   Please give him a call back.

## 2013-09-28 ENCOUNTER — Ambulatory Visit (INDEPENDENT_AMBULATORY_CARE_PROVIDER_SITE_OTHER): Payer: BC Managed Care – PPO | Admitting: Cardiology

## 2013-09-28 ENCOUNTER — Encounter: Payer: Self-pay | Admitting: Cardiology

## 2013-09-28 VITALS — BP 122/78 | HR 65 | Ht 65.0 in | Wt 231.0 lb

## 2013-09-28 DIAGNOSIS — K219 Gastro-esophageal reflux disease without esophagitis: Secondary | ICD-10-CM

## 2013-09-28 DIAGNOSIS — R002 Palpitations: Secondary | ICD-10-CM

## 2013-09-28 DIAGNOSIS — R079 Chest pain, unspecified: Secondary | ICD-10-CM

## 2013-09-28 MED ORDER — PANTOPRAZOLE SODIUM 40 MG PO TBEC: 40.0000 mg | DELAYED_RELEASE_TABLET | Freq: Every day | ORAL | Status: DC

## 2013-09-28 NOTE — Progress Notes (Signed)
[address] [place], [place]  [address] Phone: [phone] Fax:  [phone]  Date:  09/28/2013   ID:  Jerry Larson, DOB 1956/07/04, MRN [id number]  PCP:  Default, Provider, MD  Cardiologist:  Armanda Magic, MD    History of Present Illness:Jerry Larson is a 57 y.o. male with a history of GERD who presented recently for evaluation of CP. He has had CP since the 1980's from GERD and had a remote ETT in the past. A few weeks ago he developed CP while sitting a church. He described it as feeling his heart beating through his clothes and skipped beats. He also had pain which he described as a pressure 4/10. He went to [facility] ER where his workup was normal. He thinks the pain lasted 30 minutes. The CP occurs every other day a few times a day. He felt some SOB in the ER. He denies any nausea or diaphoresis. He underwent nuclear stress test which showed diaphragmatic attenuation with normal LVF and no ischemia and 2D echo with normal LVF.  He says that he had an episode yesterday while sitting with some left arm pain while not exerting him.  He had some pain in his chest the other night while sitting 30 minutes after eating dinner and was described as a pressure.       Wt Readings from Last 3 Encounters:  09/28/13 231 lb (104.781 kg)  09/04/13 226 lb (102.513 kg)  09/01/13 277 lb (125.646 kg)     Past Medical History  Diagnosis Date  . Hyperlipidemia   . Acid reflux   . Hiatal hernia   . Gout   . DDD (degenerative disc disease)   . Obesity   . Osteopenia     Current Outpatient Prescriptions  Medication Sig Dispense Refill  . furosemide (LASIX) 20 MG tablet Take 20 mg by mouth daily.      . indomethacin (INDOCIN) 25 MG capsule Take 2 capsules (50 mg total) by mouth 3 (three) times daily as needed.  30 capsule  0  . omeprazole (PRILOSEC) 20 MG capsule Take 20 mg by mouth daily. For heart burn      . [DISCONTINUED] famotidine (PEPCID AC) 10 MG chewable tablet Chew 10 mg by  mouth 2 (two) times daily.       No current facility-administered medications for this visit.    Allergies:    Allergies  Allergen Reactions  . Lipitor [Atorvastatin Calcium] Other (See Comments)    Caused joint aches  . Zocor [Simvastatin] Other (See Comments)    Interfered with blood count    Social History:  The patient  reports that he has never smoked. He does not have any smokeless tobacco history on file. He reports that he does not drink alcohol or use illicit drugs.   Family History:  The patient's family history includes Hypertension in his mother and sister; Pancreatic cancer in his father.   ROS:  Please see the history of present illness.      All other systems reviewed and negative.   PHYSICAL EXAM: VS:  BP 122/78  Pulse 65  Ht 5\' 5"  (1.651 m)  Wt 231 lb (104.781 kg)  BMI 38.44 kg/m2 Well nourished, well developed, in no acute distress HEENT: normal Neck: no JVD Cardiac:  normal S1, S2; RRR; no murmur Lungs:  clear to auscultation bilaterally, no wheezing, rhonchi or rales Abd: soft, nontender, no hepatomegaly Ext: no edema Skin: warm and dry Neuro:  CNs  2-12 intact, no focal abnormalities noted       ASSESSMENT AND PLAN:  1. Chest pain with no evidence of inducible ischemia on recent nuclear stress test 2. Palpitations - he is currently wearing a heart monitor 3. GERD - he has a history of this in the past and I am suspicious that his CP may be from this  - start Protonix 40mg  daily  - he is waiting to get in to see his GI MD  - stop omeprazole PRN which he has not been taking lately  Followup with me in 4 weeks   Signed, Armanda Magic, MD 09/28/2013 4:19 PM

## 2013-09-28 NOTE — Patient Instructions (Addendum)
Your physician has recommended you make the following change in your medication:Stop Omeprazole and  Start Protonix 40 Mg Daily  Your physician recommends that you schedule a follow-up appointment in:  4 weeks with Dr. Mayford Knife

## 2013-10-05 ENCOUNTER — Encounter: Payer: Self-pay | Admitting: Gastroenterology

## 2013-10-06 ENCOUNTER — Encounter: Payer: Self-pay | Admitting: *Deleted

## 2013-10-10 ENCOUNTER — Ambulatory Visit (INDEPENDENT_AMBULATORY_CARE_PROVIDER_SITE_OTHER): Payer: BC Managed Care – PPO | Admitting: Gastroenterology

## 2013-10-10 ENCOUNTER — Encounter: Payer: Self-pay | Admitting: Gastroenterology

## 2013-10-10 VITALS — BP 100/60 | HR 86 | Ht 65.0 in | Wt 234.6 lb

## 2013-10-10 DIAGNOSIS — R079 Chest pain, unspecified: Secondary | ICD-10-CM

## 2013-10-10 DIAGNOSIS — R109 Unspecified abdominal pain: Secondary | ICD-10-CM

## 2013-10-10 DIAGNOSIS — K219 Gastro-esophageal reflux disease without esophagitis: Secondary | ICD-10-CM

## 2013-10-10 MED ORDER — PANTOPRAZOLE SODIUM 40 MG PO TBEC: 40.0000 mg | DELAYED_RELEASE_TABLET | Freq: Two times a day (BID) | ORAL | Status: DC

## 2013-10-10 NOTE — Progress Notes (Addendum)
History of Present Illness:  This is a 57 year old Caucasian male recently hospitalized because of atypical chest pain with a negative cardiac workup.  He's had 20 years of rather classic acid reflux with burning substernal chest pain, regurgitation, and has had progressive solid food dysphagia over the last year.  He currently is on omeprazole 40 mg a day with mild improvement.  Llooking at his medications it seems he is on Protonix 40 mg a day.  Pain is described as a sharp or burning pain without radiation, and there no associated cardiopulmonary symptoms.  He has not had previous endoscopy or barium studies, ultrasound, we'll CT scans.  He did have a negative colonoscopy several years ago.  It is of note, the patient gained 60 pounds in weight over the last 5 years.  He denies Raynaud's phenomena or other symptoms of collagen vascular disease.  Family history is remarkable for pancreatic cancer his father.  He does not smoke, abuse ethanol or NSAIDs.  I have reviewed this patient's present history, medical and surgical past history, allergies and medications.     ROS:   All systems were reviewed and are negative unless otherwise stated in the HPI.  Allergies  Allergen Reactions  . Lipitor [Atorvastatin Calcium] Other (See Comments)    Caused joint aches  . Zocor [Simvastatin] Other (See Comments)    Interfered with blood count   Outpatient Prescriptions Prior to Visit  Medication Sig Dispense Refill  . furosemide (LASIX) 20 MG tablet Take 20 mg by mouth daily.      . indomethacin (INDOCIN) 25 MG capsule Take 2 capsules (50 mg total) by mouth 3 (three) times daily as needed.  30 capsule  0  . pantoprazole (PROTONIX) 40 MG tablet Take 1 tablet (40 mg total) by mouth daily.  30 tablet  11   No facility-administered medications prior to visit.   Past Medical History  Diagnosis Date  . Hyperlipidemia   . Acid reflux   . Hiatal hernia   . Gout   . DDD (degenerative disc disease)   .  Obesity   . Osteopenia   . Diverticulosis of colon (without mention of hemorrhage)   . Anxiety    Past Surgical History  Procedure Laterality Date  . Colonoscopy     History   Social History  . Marital Status: Divorced    Spouse Name: N/A    Number of Children: N/A  . Years of Education: N/A   Social History Main Topics  . Smoking status: Never Smoker   . Smokeless tobacco: None  . Alcohol Use: No  . Drug Use: No  . Sexual Activity: None   Other Topics Concern  . None   Social History Narrative  . None   Family History  Problem Relation Age of Onset  . Pancreatic cancer Father   . Hypertension Mother   . Hypertension Sister        Physical Exam: Blood pressure 100/60, pulse 86 and regular and weight 234 with a BMI of 39.04. General well developed well nourished patient in no acute distress, appearing their stated age Eyes PERRLA, no icterus, fundoscopic exam per opthamologist Skin no lesions noted Neck supple, no adenopathy, no thyroid enlargement, no tenderness Chest clear to percussion and auscultation Heart no significant murmurs, gallops or rubs noted Abdomen no hepatosplenomegaly masses or tenderness, BS normal.  Extremities no acute joint lesions, edema, phlebitis or evidence of cellulitis. Neurologic patient oriented x 3, cranial nerves intact, no focal neurologic  deficits noted. Psychological mental status normal and normal affect.  Assessment and plan: Probable large hiatal hernia and erosive esophagitis, rule out Barrett's mucosa.  This patient acid reflux is obviously exacerbated by his obesity and rather massive weight gain over the last 5 years.  His dysphagia is disconcerting, and we certainly need to exclude esophageal cancer.  I set him up for endoscopy his convenience and have continued him on twice a day PPI therapy along with standard antireflux maneuvers.  His continue other medications as per primary care.

## 2013-10-10 NOTE — Patient Instructions (Addendum)
Increase Protonix to one tablet twice daily, new prescription sent to your pharmacy   You have been scheduled for an endoscopy with propofol. Please follow written instructions given to you at your visit today. If you use inhalers (even only as needed), please bring them with you on the day of your procedure. Your physician has requested that you go to www.startemmi.com and enter the access code given to you at your visit today. This web site gives a general overview about your procedure. However, you should still follow specific instructions given to you by our office regarding your preparation for the procedure.  Information on acid reflux is below for your review. ___________________________________________________________________________________________________  Diet for Gastroesophageal Reflux Disease, Adult Reflux (acid reflux) is when acid from your stomach flows up into the esophagus. When acid comes in contact with the esophagus, the acid causes irritation and soreness (inflammation) in the esophagus. When reflux happens often or so severely that it causes damage to the esophagus, it is called gastroesophageal reflux disease (GERD). Nutrition therapy can help ease the discomfort of GERD. FOODS OR DRINKS TO AVOID OR LIMIT  Smoking or chewing tobacco. Nicotine is one of the most potent stimulants to acid production in the gastrointestinal tract.  Caffeinated and decaffeinated coffee and black tea.  Regular or low-calorie carbonated beverages or energy drinks (caffeine-free carbonated beverages are allowed).   Strong spices, such as black pepper, white pepper, red pepper, cayenne, curry powder, and chili powder.  Peppermint or spearmint.  Chocolate.  High-fat foods, including meats and fried foods. Extra added fats including oils, butter, salad dressings, and nuts. Limit these to less than 8 tsp per day.  Fruits and vegetables if they are not tolerated, such as citrus fruits or  tomatoes.  Alcohol.  Any food that seems to aggravate your condition. If you have questions regarding your diet, call your caregiver or a registered dietitian. OTHER THINGS THAT MAY HELP GERD INCLUDE:   Eating your meals slowly, in a relaxed setting.  Eating 5 to 6 small meals per day instead of 3 large meals.  Eliminating food for a period of time if it causes distress.  Not lying down until 3 hours after eating a meal.  Keeping the head of your bed raised 6 to 9 inches (15 to 23 cm) by using a foam wedge or blocks under the legs of the bed. Lying flat may make symptoms worse.  Being physically active. Weight loss may be helpful in reducing reflux in overweight or obese adults.  Wear loose fitting clothing EXAMPLE MEAL PLAN This meal plan is approximately 2,000 calories based on https://www.bernard.org/ meal planning guidelines. Breakfast   cup cooked oatmeal.  1 cup strawberries.  1 cup low-fat milk.  1 oz almonds. Snack  1 cup cucumber slices.  6 oz yogurt (made from low-fat or fat-free milk). Lunch  2 slice whole-wheat bread.  2 oz sliced [place].  2 tsp mayonnaise.  1 cup blueberries.  1 cup snap peas. Snack  6 whole-wheat crackers.  1 oz string cheese. Dinner   cup brown rice.  1 cup mixed veggies.  1 tsp olive oil.  3 oz grilled fish. Document Released: 11/02/2005 Document Revised: 01/25/2012 Document Reviewed: 09/18/2011 [facility] Patient Information 2014 [place], [place].

## 2013-10-15 ENCOUNTER — Emergency Department (HOSPITAL_COMMUNITY)
Admission: EM | Admit: 2013-10-15 | Discharge: 2013-10-15 | Disposition: A | Discharge status: Home / Self Care | Payer: BC Managed Care – PPO | Attending: Emergency Medicine | Admitting: Emergency Medicine

## 2013-10-15 ENCOUNTER — Emergency Department (HOSPITAL_COMMUNITY): Payer: BC Managed Care – PPO

## 2013-10-15 ENCOUNTER — Encounter (HOSPITAL_COMMUNITY): Payer: Self-pay | Admitting: Emergency Medicine

## 2013-10-15 DIAGNOSIS — Z8659 Personal history of other mental and behavioral disorders: Secondary | ICD-10-CM | POA: Insufficient documentation

## 2013-10-15 DIAGNOSIS — M109 Gout, unspecified: Secondary | ICD-10-CM | POA: Insufficient documentation

## 2013-10-15 DIAGNOSIS — Z8639 Personal history of other endocrine, nutritional and metabolic disease: Secondary | ICD-10-CM | POA: Insufficient documentation

## 2013-10-15 DIAGNOSIS — E669 Obesity, unspecified: Secondary | ICD-10-CM | POA: Insufficient documentation

## 2013-10-15 DIAGNOSIS — K219 Gastro-esophageal reflux disease without esophagitis: Secondary | ICD-10-CM | POA: Insufficient documentation

## 2013-10-15 DIAGNOSIS — Z79899 Other long term (current) drug therapy: Secondary | ICD-10-CM | POA: Insufficient documentation

## 2013-10-15 DIAGNOSIS — K5732 Diverticulitis of large intestine without perforation or abscess without bleeding: Secondary | ICD-10-CM | POA: Insufficient documentation

## 2013-10-15 DIAGNOSIS — IMO0002 Reserved for concepts with insufficient information to code with codable children: Secondary | ICD-10-CM | POA: Insufficient documentation

## 2013-10-15 DIAGNOSIS — Z862 Personal history of diseases of the blood and blood-forming organs and certain disorders involving the immune mechanism: Secondary | ICD-10-CM | POA: Insufficient documentation

## 2013-10-15 HISTORY — DX: Diverticulitis of large intestine without perforation or abscess without bleeding: K57.32

## 2013-10-15 LAB — COMPREHENSIVE METABOLIC PANEL
ALT: 17 U/L (ref 0–53)
AST: 20 U/L (ref 0–37)
Albumin: 3.9 g/dL (ref 3.5–5.2)
Alkaline Phosphatase: 88 U/L (ref 39–117)
BUN: 14 mg/dL (ref 6–23)
Chloride: 98 mEq/L (ref 96–112)
Potassium: 4.1 mEq/L (ref 3.5–5.1)
Sodium: 137 mEq/L (ref 135–145)
Total Bilirubin: 0.5 mg/dL (ref 0.3–1.2)
Total Protein: 7.2 g/dL (ref 6.0–8.3)

## 2013-10-15 LAB — CBC WITH DIFFERENTIAL/PLATELET
Basophils Absolute: 0 10*3/uL (ref 0.0–0.1)
Basophils Relative: 0 % (ref 0–1)
HCT: 42.6 % (ref 39.0–52.0)
Hemoglobin: 14.6 g/dL (ref 13.0–17.0)
Lymphocytes Relative: 19 % (ref 12–46)
MCH: 30.2 pg (ref 26.0–34.0)
MCHC: 34.3 g/dL (ref 30.0–36.0)
Monocytes Absolute: 1.2 10*3/uL — ABNORMAL HIGH (ref 0.1–1.0)
Monocytes Relative: 9 % (ref 3–12)
Neutro Abs: 9.7 10*3/uL — ABNORMAL HIGH (ref 1.7–7.7)
Neutrophils Relative %: 72 % (ref 43–77)
Platelets: 248 10*3/uL (ref 150–400)
WBC: 13.6 10*3/uL — ABNORMAL HIGH (ref 4.0–10.5)

## 2013-10-15 LAB — URINALYSIS, ROUTINE W REFLEX MICROSCOPIC
Bilirubin Urine: NEGATIVE
Glucose, UA: NEGATIVE mg/dL
Hgb urine dipstick: NEGATIVE
Ketones, ur: NEGATIVE mg/dL
Protein, ur: NEGATIVE mg/dL
Specific Gravity, Urine: 1.019 (ref 1.005–1.030)
pH: 5 (ref 5.0–8.0)

## 2013-10-15 MED ORDER — HYDROMORPHONE HCL PF 1 MG/ML IJ SOLN
0.5000 mg | Freq: Once | INTRAMUSCULAR | Status: AC
  Administered 2013-10-15: 0.5 mg via INTRAVENOUS
  Filled 2013-10-15: qty 1

## 2013-10-15 MED ORDER — CIPROFLOXACIN HCL 500 MG PO TABS
500.0000 mg | ORAL_TABLET | ORAL | Status: AC
  Administered 2013-10-15: 500 mg via ORAL
  Filled 2013-10-15: qty 1

## 2013-10-15 MED ORDER — METRONIDAZOLE 500 MG PO TABS: 500.0000 mg | ORAL_TABLET | Freq: Three times a day (TID) | ORAL | Status: DC

## 2013-10-15 MED ORDER — IOHEXOL 300 MG/ML  SOLN
25.0000 mL | INTRAMUSCULAR | Status: DC | PRN
  Administered 2013-10-15: 25 mL via ORAL

## 2013-10-15 MED ORDER — CIPROFLOXACIN HCL 500 MG PO TABS: 500.0000 mg | ORAL_TABLET | Freq: Two times a day (BID) | ORAL | Status: DC

## 2013-10-15 MED ORDER — METRONIDAZOLE 500 MG PO TABS
500.0000 mg | ORAL_TABLET | ORAL | Status: AC
  Administered 2013-10-15: 500 mg via ORAL
  Filled 2013-10-15: qty 1

## 2013-10-15 MED ORDER — OXYCODONE-ACETAMINOPHEN 5-325 MG PO TABS: 1.0000 | ORAL_TABLET | Freq: Four times a day (QID) | ORAL | Status: DC | PRN

## 2013-10-15 MED ORDER — IOHEXOL 300 MG/ML  SOLN
100.0000 mL | Freq: Once | INTRAMUSCULAR | Status: AC | PRN
  Administered 2013-10-15: 100 mL via INTRAVENOUS

## 2013-10-15 MED ORDER — SODIUM CHLORIDE 0.9 % IV BOLUS (SEPSIS)
1000.0000 mL | INTRAVENOUS | Status: AC
  Administered 2013-10-15: 1000 mL via INTRAVENOUS

## 2013-10-15 NOTE — ED Notes (Signed)
Pt. reports low abdominal pain onset this morning , denies nausea or vomitting , no urinary discomfort . No fever or chills.

## 2013-10-15 NOTE — ED Provider Notes (Signed)
CSN: [id number]     Arrival date & time 10/15/13  0303 History   First MD Initiated Contact with Patient 10/15/13 0421     Chief Complaint  Patient presents with  . Abdominal Pain   (Consider location/radiation/quality/duration/timing/severity/associated sxs/prior Treatment) Patient is a 57 y.o. male presenting with abdominal pain. The history is provided by the patient.  Abdominal Pain Pain location: lower abdomen. Pain quality: sharp   Pain radiates to:  Does not radiate Pain severity:  Moderate Onset quality:  Sudden Duration:  5 hours Timing:  Intermittent Progression:  Unchanged Chronicity:  New Context comment:  In bed Relieved by:  Nothing Worsened by:  Nothing tried Ineffective treatments:  None tried Associated symptoms: no chest pain, no cough, no diarrhea, no dysuria, no fever, no hematuria, no nausea, no shortness of breath and no vomiting     Past Medical History  Diagnosis Date  . Hyperlipidemia   . Acid reflux   . Hiatal hernia   . Gout   . DDD (degenerative disc disease)   . Obesity   . Osteopenia   . Diverticulosis of colon (without mention of hemorrhage)   . Anxiety    Past Surgical History  Procedure Laterality Date  . Colonoscopy     Family History  Problem Relation Age of Onset  . Pancreatic cancer Father   . Hypertension Mother   . Hypertension Sister    History  Substance Use Topics  . Smoking status: Never Smoker   . Smokeless tobacco: Not on file  . Alcohol Use: No    Review of Systems  Constitutional: Negative for fever.  HENT: Negative for drooling and rhinorrhea.   Eyes: Negative for pain.  Respiratory: Negative for cough and shortness of breath.   Cardiovascular: Negative for chest pain and leg swelling.  Gastrointestinal: Positive for abdominal pain. Negative for nausea, vomiting and diarrhea.  Genitourinary: Negative for dysuria and hematuria.  Musculoskeletal: Negative for gait problem and neck pain.  Skin: Negative for  color change.  Neurological: Negative for numbness and headaches.  Hematological: Negative for adenopathy.  Psychiatric/Behavioral: Negative for behavioral problems.  All other systems reviewed and are negative.    Allergies  Lipitor and Zocor  Home Medications   Current Outpatient Rx  Name  Route  Sig  Dispense  Refill  . indomethacin (INDOCIN) 25 MG capsule   Oral   Take 25 mg by mouth 2 (two) times daily as needed (for gout).         . pantoprazole (PROTONIX) 40 MG tablet   Oral   Take 1 tablet (40 mg total) by mouth 2 (two) times daily.   60 tablet   4   . simethicone (GAS-X) 80 MG chewable tablet   Oral   Chew 160 mg by mouth every 6 (six) hours as needed for flatulence.          BP 115/64  Pulse 81  Temp(Src) 98 F (36.7 C) (Oral)  Resp 20  SpO2 96% Physical Exam  Nursing note and vitals reviewed. Constitutional: He is oriented to person, place, and time. He appears well-developed and well-nourished.  HENT:  Head: Normocephalic and atraumatic.  Right Ear: External ear normal.  Left Ear: External ear normal.  Nose: Nose normal.  Mouth/Throat: Oropharynx is clear and moist. No oropharyngeal exudate.  Eyes: Conjunctivae and EOM are normal. Pupils are equal, round, and reactive to light.  Neck: Normal range of motion. Neck supple.  Cardiovascular: Normal rate, regular rhythm, normal heart  sounds and intact distal pulses.  Exam reveals no gallop and no friction rub.   No murmur heard. Pulmonary/Chest: Effort normal and breath sounds normal. No respiratory distress. He has no wheezes.  Abdominal: Soft. Bowel sounds are normal. He exhibits no distension. There is tenderness (mild non-specific ttp to lower abdomen, does not seem to lateralize, worse in the midline). There is no rebound and no guarding.  Musculoskeletal: Normal range of motion. He exhibits no edema and no tenderness.  Neurological: He is alert and oriented to person, place, and time.  Skin: Skin  is warm and dry.  Psychiatric: He has a normal mood and affect. His behavior is normal.    ED Course  Procedures (including critical care time) Labs Review Labs Reviewed  CBC WITH DIFFERENTIAL - Abnormal; Notable for the following:    WBC 13.6 (*)    Neutro Abs 9.7 (*)    Monocytes Absolute 1.2 (*)    All other components within normal limits  COMPREHENSIVE METABOLIC PANEL - Abnormal; Notable for the following:    Glucose, Bld 108 (*)    Creatinine, Ser 1.43 (*)    GFR calc non Af Amer 52 (*)    GFR calc Af Amer 60 (*)    All other components within normal limits  URINALYSIS, ROUTINE W REFLEX MICROSCOPIC - Abnormal; Notable for the following:    APPearance CLOUDY (*)    All other components within normal limits  LIPASE, BLOOD   Imaging Review Ct Abdomen Pelvis W Contrast  10/15/2013   CLINICAL DATA:  Low abdominal pain, onset this morning. Elevated white cell count at 13.6.  EXAM: CT ABDOMEN AND PELVIS WITH CONTRAST  TECHNIQUE: Multidetector CT imaging of the abdomen and pelvis was performed using the standard protocol following bolus administration of intravenous contrast.  CONTRAST:  OMNIPAQUE IOHEXOL 300 MG/ML  SOLN  COMPARISON:  None.  FINDINGS: Dependent atelectasis in the lung bases. Focal subpleural nodule in the right lung base measures 7 mm. If the patient is at high risk for bronchogenic carcinoma, follow-up chest CT at 3-6 months is recommended. If the patient is at low risk for bronchogenic carcinoma, follow-up chest CT at 6-12 months is recommended. This recommendation follows the consensus statement: Guidelines for Management of Small Pulmonary Nodules Detected on CT Scans: A Statement from the Fleischner Society as published in Radiology 2005; 237:395-400.  The liver, spleen, gallbladder, pancreas, adrenal glands, kidneys, abdominal aorta, inferior vena cava, and retroperitoneal lymph nodes are unremarkable. Small accessory spleen. The stomach, small bowel, and colon  are not abnormally distended. No free air or free fluid in the abdomen. Abdominal wall musculature appears intact.  Pelvis: Diverticulosis of the sigmoid colon. Focal colonic wall thickening and pericolonic inflammatory stranding in the upper sigmoid region consistent with acute diverticulitis. No focal abscess is demonstrated.  The prostate gland is enlarged, measuring 5.7 x 4.6 cm. The bladder wall is not thickened. The appendix is normal. No significant pelvic lymphadenopathy. Normal alignment of the lumbar spine. Mild degenerative changes. No destructive bone lesions appreciated.  IMPRESSION: Diverticulosis of the sigmoid colon with pericolonic inflammatory changes consistent with sigmoid diverticulitis. No abscess.   Electronically Signed   By: Burman Nieves M.D.   On: 10/15/2013 07:07    EKG Interpretation   None       MDM   1. Diverticulitis of sigmoid colon    4:50 AM 57 y.o. male who presents with lower abdominal pain which began early this morning and woke him  from sleep. The patient describes the pain as sharp and intermittent. He is currently not having on exam. He states that he does have a history of diverticulosis. He states that he has been stooling normally. He denies any fevers, vomiting, or diarrhea. He is afebrile and vital signs are unremarkable here. Will get pain control the lauded and a CT scan of his abdomen.  7:51 AM: CT c/w sigmoid diverticulitis. Pt's pain is controlled and he continues to appear well. I discussed admission versus tx at home and he agrees that tx at home is reasonable.  I have discussed the diagnosis/risks/treatment options with the patient and believe the pt to be eligible for discharge home to follow-up with pcp as needed. We also discussed returning to the ED immediately if new or worsening sx occur. We discussed the sx which are most concerning (e.g., worsening pain, vomiting or inability to tolerate the abx,) that necessitate immediate return. Any  new prescriptions provided to the patient are listed below.  New Prescriptions   CIPROFLOXACIN (CIPRO) 500 MG TABLET    Take 1 tablet (500 mg total) by mouth 2 (two) times daily. One po bid x 7 days   METRONIDAZOLE (FLAGYL) 500 MG TABLET    Take 1 tablet (500 mg total) by mouth 3 (three) times daily. One po bid x 7 days   OXYCODONE-ACETAMINOPHEN (PERCOCET) 5-325 MG PER TABLET    Take 1 tablet by mouth every 6 (six) hours as needed.     Junius Argyle, MD 10/16/13 0400

## 2013-10-15 NOTE — ED Notes (Signed)
IV attempt x2  Unsuccessful.  

## 2013-10-16 ENCOUNTER — Encounter: Payer: Self-pay | Admitting: Gastroenterology

## 2013-10-16 ENCOUNTER — Ambulatory Visit: Payer: BC Managed Care – PPO | Admitting: Gastroenterology

## 2013-10-16 VITALS — BP 119/74 | HR 63 | Temp 99.0°F | Ht 65.0 in | Wt 234.0 lb

## 2013-10-16 MED ORDER — SODIUM CHLORIDE 0.9 % IV SOLN: 500.0000 mL | INTRAVENOUS | Status: DC

## 2013-10-16 NOTE — Progress Notes (Signed)
Cx by Dr. Jarold Motto for current Diverticulitis on antibiotics just discharged per patient from Hospital last night.

## 2013-10-16 NOTE — Progress Notes (Signed)
Dr. Jarold Motto cancelled procedure d/t patient's recent ER visit with dx of diverticulitis.  Instructed patient to continue and complete Cipro and Flagyl and call to reschedule procedure.

## 2013-10-19 ENCOUNTER — Telehealth: Payer: Self-pay | Admitting: Cardiology

## 2013-10-19 NOTE — Telephone Encounter (Signed)
Please let patient know that heart monitor showed NSR with occasional extra heart beats from the top of the heart which are benigh

## 2013-10-19 NOTE — Telephone Encounter (Signed)
Pt is aware and confirmed his 4 week f/u with Dr Mayford Knife.

## 2013-11-03 ENCOUNTER — Ambulatory Visit (INDEPENDENT_AMBULATORY_CARE_PROVIDER_SITE_OTHER): Payer: BC Managed Care – PPO | Admitting: Cardiology

## 2013-11-03 ENCOUNTER — Encounter: Payer: Self-pay | Admitting: Cardiology

## 2013-11-03 VITALS — BP 118/64 | HR 68 | Ht 65.0 in | Wt 216.8 lb

## 2013-11-03 DIAGNOSIS — R079 Chest pain, unspecified: Secondary | ICD-10-CM

## 2013-11-03 DIAGNOSIS — K219 Gastro-esophageal reflux disease without esophagitis: Secondary | ICD-10-CM

## 2013-11-03 NOTE — Progress Notes (Signed)
[address] [place], [place]  [address] Phone: [phone] Fax:  [phone]  Date:  11/03/2013   ID:  Jerry Larson, DOB 05-03-1956, MRN [id number]  PCP:  Leanor Rubenstein, MD  Cardiologist:  Armanda Magic, MD     History of Present Illness:Jerry Larson is a 57 y.o. male with a history of GERD who presented recently for evaluation of CP. He has had CP since the 1980's from GERD and had a remote ETT in the past. A few weeks ago he developed CP while sitting a church. He described it as feeling his Larson beating through his clothes and skipped beats. He also had pain which he described as a pressure 4/10. He went to [facility] ER where his workup was normal. He thinks the pain lasted 30 minutes. The CP occurs every other day a few times a day. He felt some SOB in the ER. He denied any nausea or diaphoresis. He underwent nuclear stress test which showed diaphragmatic attenuation with normal LVF and no ischemia and 2D echo with normal LVF. He continued to have some chest discomfort while sitting after eating dinner and it was felt he may have GERD and was placed on Protonix and now presents back for followup.  He has not had his endoscopy yet because he had a flare of diverticulitis.  His GI MD increased his Protonix to BID.  His CP has completely resolved on the Protonix with no further episodes.  He denies any SOB.      Wt Readings from Last 3 Encounters:  11/03/13 216 lb 12.8 oz (98.34 kg)  10/16/13 234 lb (106.142 kg)  10/10/13 234 lb 9.6 oz (106.414 kg)     Past Medical History  Diagnosis Date  . Hyperlipidemia   . Acid reflux   . Hiatal hernia   . Gout   . DDD (degenerative disc disease)   . Obesity   . Osteopenia   . Diverticulosis of colon (without mention of hemorrhage)   . Anxiety     Current Outpatient Prescriptions  Medication Sig Dispense Refill  . indomethacin (INDOCIN) 25 MG capsule Take 25 mg by mouth 2 (two) times daily as needed (for gout).      . pantoprazole  (PROTONIX) 40 MG tablet Take 1 tablet (40 mg total) by mouth 2 (two) times daily.  60 tablet  4  . [DISCONTINUED] famotidine (PEPCID AC) 10 MG chewable tablet Chew 10 mg by mouth 2 (two) times daily.       No current facility-administered medications for this visit.    Allergies:    Allergies  Allergen Reactions  . Lipitor [Atorvastatin Calcium] Other (See Comments)    Caused joint aches  . Zocor [Simvastatin] Other (See Comments)    Interfered with blood count    Social History:  The patient  reports that he has never smoked. He does not have any smokeless tobacco history on file. He reports that he does not drink alcohol or use illicit drugs.   Family History:  The patient's family history includes Hypertension in his mother and sister; Pancreatic cancer in his father.   ROS:  Please see the history of present illness.      All other systems reviewed and negative.   PHYSICAL EXAM: VS:  BP 118/64  Pulse 68  Ht 5\' 5"  (1.651 m)  Wt 216 lb 12.8 oz (98.34 kg)  BMI 36.08 kg/m2 Well nourished, well developed, in no acute distress HEENT: normal Neck: no JVD  Cardiac:  normal S1, S2; RRR; no murmur Lungs:  clear to auscultation bilaterally, no wheezing, rhonchi or rales      ASSESSMENT AND PLAN:  1. Atypical CP with no ischemia on recent stress test most c/w GERD and resolved with Protonix 2. Palpitations - Larson monitor showed a few PAC's which I reassured the patient were benign 3. GERD now on Protonix  Followup with me in 3 months  Signed, Armanda Magic, MD 11/03/2013 4:28 PM

## 2013-11-03 NOTE — Patient Instructions (Signed)
Your physician recommends that you continue on your current medications as directed. Please refer to the Current Medication list given to you today.  Your physician recommends that you schedule a follow-up appointment in: 3 months with Dr Turner 

## 2013-12-14 ENCOUNTER — Encounter: Payer: Self-pay | Admitting: Gastroenterology

## 2013-12-18 ENCOUNTER — Ambulatory Visit (AMBULATORY_SURGERY_CENTER): Payer: Self-pay | Admitting: *Deleted

## 2013-12-18 VITALS — Ht 65.0 in | Wt 210.0 lb

## 2013-12-18 DIAGNOSIS — K219 Gastro-esophageal reflux disease without esophagitis: Secondary | ICD-10-CM

## 2013-12-18 NOTE — Progress Notes (Signed)
Patient denies any allergies to eggs or soy. Patient denies any problems with anesthesia.  

## 2013-12-19 ENCOUNTER — Encounter: Payer: Self-pay | Admitting: Gastroenterology

## 2013-12-25 ENCOUNTER — Encounter: Payer: Self-pay | Admitting: Gastroenterology

## 2013-12-25 ENCOUNTER — Ambulatory Visit (AMBULATORY_SURGERY_CENTER): Payer: BC Managed Care – PPO | Admitting: Gastroenterology

## 2013-12-25 VITALS — BP 102/65 | HR 55 | Temp 98.4°F | Resp 11 | Ht 65.0 in | Wt 210.0 lb

## 2013-12-25 DIAGNOSIS — K219 Gastro-esophageal reflux disease without esophagitis: Secondary | ICD-10-CM

## 2013-12-25 DIAGNOSIS — K2970 Gastritis, unspecified, without bleeding: Secondary | ICD-10-CM

## 2013-12-25 DIAGNOSIS — K2990 Gastroduodenitis, unspecified, without bleeding: Secondary | ICD-10-CM

## 2013-12-25 DIAGNOSIS — R1310 Dysphagia, unspecified: Secondary | ICD-10-CM

## 2013-12-25 DIAGNOSIS — R079 Chest pain, unspecified: Secondary | ICD-10-CM

## 2013-12-25 HISTORY — PX: ESOPHAGOGASTRODUODENOSCOPY: SHX1529

## 2013-12-25 MED ORDER — SODIUM CHLORIDE 0.9 % IV SOLN: 500.0000 mL | INTRAVENOUS | Status: DC

## 2013-12-25 MED ORDER — PANTOPRAZOLE SODIUM 40 MG PO TBEC: 40.0000 mg | DELAYED_RELEASE_TABLET | Freq: Two times a day (BID) | ORAL | Status: DC

## 2013-12-25 NOTE — Op Note (Signed)
[facility] [address] [place], [id number]   ENDOSCOPY PROCEDURE REPORT  PATIENT: Jerry Larson, Deny  MR#: [id number] BIRTHDATE: 1955/11/22 , 57  yrs. old GENDER: Male ENDOSCOPIST:David Hale Bogus Patterson, MD, Clementeen GrahamFACG REFERRED BY: Deatra JamesVyvyan Sun, M.D. PROCEDURE DATE:  12/25/2013 PROCEDURE:   EGD w/ biopsy for H.pylori and Maloney dilation of esophagus ASA CLASS:    Class II INDICATIONS: Chest pain, History of esophageal reflux, and Dysphagia. MEDICATION: propofol (Diprivan) 150mg  IV TOPICAL ANESTHETIC:  DESCRIPTION OF PROCEDURE:   After the risks and benefits of the procedure were explained, informed consent was obtained.  The LB [id number]  endoscope was introduced through the mouth  and advanced to the second portion of the duodenum .  The instrument was slowly withdrawn as the mucosa was fully examined.      DUODENUM: The duodenal mucosa showed no abnormalities in the bulb and second portion of the duodenum.  STOMACH: There was mild antral gastropathy noted.Erythema but no erosions or ulcerations noted.  Cold forcep biopsies were taken at the antrum for CLO exam.  ESOPHAGUS: The mucosa of the esophagus appeared normal. Retroflexed views revealed a 3 cm. hiatal hernia.    The scope was then withdrawn from the patient and the procedure completed.No definite stricture noted. Patient dilated #54 F Maloney dilator...no heme or pain noted.  COMPLICATIONS: There were no complications.   ENDOSCOPIC IMPRESSION: 1.   The duodenal mucosa showed no abnormalities in the bulb and second portion of the duodenum 2.   There was mild antral gastropathy noted [T2]...r/o + H.pylori 3.   The mucosa of the esophagus appeared normal...treated GERD.No large HH or drfinite stricture noted.l  RECOMMENDATIONS: 1.  Await pathology results 2.  Continue PPI 3.  Dilatations PRN    _______________________________ eSigned:  Mardella Laymanavid R Patterson, MD, [facility] Center[facility]ACG 12/25/2013 1:51 PM  CC  Dr. Carolanne Grumblingracy Turner standard discharge   PATIENT NAME:  Jerry Larson, Lowery MR#: [id number]

## 2013-12-25 NOTE — Progress Notes (Signed)
Called to room to assist during endoscopic procedure.  Patient ID and intended procedure confirmed with present staff. Received instructions for my participation in the procedure from the performing physician.  

## 2013-12-25 NOTE — Patient Instructions (Addendum)
YOU HAD AN ENDOSCOPIC PROCEDURE TODAY AT [facility]: Refer to the procedure report that was given to you for any specific questions about what was found during the examination.  If the procedure report does not answer your questions, please call your gastroenterologist to clarify.  If you requested that your care partner not be given the details of your procedure findings, then the procedure report has been included in a sealed envelope for you to review at your convenience later.  YOU SHOULD EXPECT: Some feelings of bloating in the abdomen. Passage of more gas than usual.  Walking can help get rid of the air that was put into your GI tract during the procedure and reduce the bloating. If you had a lower endoscopy (such as a colonoscopy or flexible sigmoidoscopy) you may notice spotting of blood in your stool or on the toilet paper. If you underwent a bowel prep for your procedure, then you may not have a normal bowel movement for a few days.  DIET: You may have a clear liquid at 3 pm.  If you tolerate that, you may go to the soft diet for the ret of the evening.  Tomorrow you may eat a regular diet.  Drink plenty of fluids but you should avoid alcoholic beverages for 24 hours.  ACTIVITY: Your care partner should take you home directly after the procedure.  You should plan to take it easy, moving slowly for the rest of the day.  You can resume normal activity the day after the procedure however you should NOT DRIVE or use heavy machinery for 24 hours (because of the sedation medicines used during the test).    SYMPTOMS TO REPORT IMMEDIATELY: A gastroenterologist can be reached at any hour.  During normal business hours, 8:30 AM to 5:00 PM Monday through Friday, call [phone].  After hours and on weekends, please call the GI answering service at [phone] who will take a message and have the physician on call contact you.   Following upper endoscopy (EGD)  Vomiting of blood  or coffee ground material  New chest pain or pain under the shoulder blades  Painful or persistently difficult swallowing  New shortness of breath  Fever of 100F or higher  Black, tarry-looking stools  FOLLOW UP: If any biopsies were taken you will be contacted by phone or by letter within the next 1-3 weeks.  Call your gastroenterologist if you have not heard about the biopsies in 3 weeks.  Our staff will call the home number listed on your records the next business day following your procedure to check on you and address any questions or concerns that you may have at that time regarding the information given to you following your procedure. This is a courtesy call and so if there is no answer at the home number and we have not heard from you through the emergency physician on call, we will assume that you have returned to your regular daily activities without incident.  SIGNATURES/CONFIDENTIALITY: You and/or your care partner have signed paperwork which will be entered into your electronic medical record.  These signatures attest to the fact that that the information above on your After Visit Summary has been reviewed and is understood.  Full responsibility of the confidentiality of this discharge information lies with you and/or your care-partner.  We will call you if your Hpylori test is positive.  Let us know if he still has trouble swallowing.  Take your anti-reflux medication.

## 2013-12-25 NOTE — Progress Notes (Signed)
Protonix re[facility]

## 2013-12-25 NOTE — Progress Notes (Signed)
A/ox3 pleased with MAC, report to Suzanne RN 

## 2013-12-26 ENCOUNTER — Telehealth: Payer: Self-pay | Admitting: *Deleted

## 2013-12-26 LAB — HELICOBACTER PYLORI SCREEN-BIOPSY: UREASE: NEGATIVE

## 2013-12-26 NOTE — Telephone Encounter (Signed)
  Follow up Call-  Call back number 12/25/2013 10/16/2013  Post procedure Call Back phone  # [phone]  Permission to leave phone message Yes Yes     Patient questions:  Do you have a fever, pain , or abdominal swelling? no Pain Score  0 *  Have you tolerated food without any problems? yes  Have you been able to return to your normal activities? yes  Do you have any questions about your discharge instructions: Diet   no Medications  yes Follow up visit  no  Do you have questions or concerns about your Care? no  Actions: * If pain score is 4 or above: No action needed, pain <4.  Patient questioned which medication he was told yesterday to avoid. Discussed with patient's recovery nurse, Clide CliffSuzanne Harned RN, informed patient to avoid NSAIDS,ASA products. Patient verbalized understanding.

## 2013-12-27 ENCOUNTER — Encounter: Payer: Self-pay | Admitting: Gastroenterology

## 2013-12-29 ENCOUNTER — Ambulatory Visit (INDEPENDENT_AMBULATORY_CARE_PROVIDER_SITE_OTHER): Payer: BC Managed Care – PPO | Admitting: Physician Assistant

## 2013-12-29 ENCOUNTER — Telehealth: Payer: Self-pay | Admitting: Gastroenterology

## 2013-12-29 ENCOUNTER — Encounter: Payer: Self-pay | Admitting: Physician Assistant

## 2013-12-29 VITALS — BP 100/60 | HR 66 | Ht 65.0 in | Wt 212.2 lb

## 2013-12-29 DIAGNOSIS — K625 Hemorrhage of anus and rectum: Secondary | ICD-10-CM

## 2013-12-29 DIAGNOSIS — K6289 Other specified diseases of anus and rectum: Secondary | ICD-10-CM

## 2013-12-29 DIAGNOSIS — K648 Other hemorrhoids: Secondary | ICD-10-CM

## 2013-12-29 MED ORDER — HYDROCORTISONE ACETATE 25 MG RE SUPP: RECTAL | Status: DC

## 2013-12-29 NOTE — Telephone Encounter (Signed)
This is a new problem he needs to see a PA about.  I would give him Calmoseptine to use locally to his perianal area twice a day in the interim.

## 2013-12-29 NOTE — Progress Notes (Signed)
Subjective:    Patient ID: Jerry Larson, male    DOB: 05-24-1956, 58 y.o.   MRN: [id number]  HPI  Jerry Larson is a pleasant 58 year old African American male known to Dr. Jarold Motto. He has history of chronic GERD. He has just recently undergone endoscopy on 12/25/2013 with finding of mild antral gastritis  H. pylori testing was negative. No stricture noted but he was empirically dilated to [address] due to complaints of mild dysphagia. He comes in today with complaints of rectal discomfort and rectal bleeding. He last had colonoscopy in April of 2012 with mild left colon diverticulosis no hemorrhoids noted at that time. He says he has had some intermittent rectal bleeding off and on over the past year or so. He is also had some intermittent internal rectal discomfort. He says this week he has noticed some dark red blood on the tissue and has had some burning discomfort internal discomfort. His bowel movements appear normal ,no melena and no blood mixed with stools.  He has no complaints of abdominal pain.     Review of Systems  Constitutional: Negative.   HENT: Negative.   Eyes: Negative.   Respiratory: Negative.   Cardiovascular: Negative.   Gastrointestinal: Positive for anal bleeding and rectal pain.  Endocrine: Negative.   Genitourinary: Negative.   Allergic/Immunologic: Negative.   Neurological: Negative.   Hematological: Negative.   Psychiatric/Behavioral: Negative.    Outpatient Prescriptions Prior to Visit  Medication Sig Dispense Refill  . indomethacin (INDOCIN) 25 MG capsule Take 25 mg by mouth 2 (two) times daily as needed (for gout).      . pantoprazole (PROTONIX) 40 MG tablet Take 1 tablet (40 mg total) by mouth 2 (two) times daily.  60 tablet  4  . pravastatin (PRAVACHOL) 40 MG tablet Take 40 mg by mouth daily.        No facility-administered medications prior to visit.       Allergies  Allergen Reactions  . Lipitor [Atorvastatin Calcium] Other (See Comments)      Caused joint aches  . Zocor [Simvastatin] Other (See Comments)    Interfered with blood count   Patient Active Problem List   Diagnosis Date Noted  . Diverticulitis of sigmoid colon 10/15/2013  . GERD (gastroesophageal reflux disease) 09/28/2013  . Dyslipidemia 09/01/2013  . Chest pain 09/01/2013  . Palpitations 09/01/2013  . Gout    History  Substance Use Topics  . Smoking status: Never Smoker   . Smokeless tobacco: Never Used  . Alcohol Use: No   family history includes Hypertension in his mother and sister; Pancreatic cancer in his father. There is no history of Colon cancer or Stomach cancer.  Objective:   Physical Exam well-developed male in no acute distress, pleasant blood pressure 100/60 pulse 66 height 5 foot 5 weight 212. HEENT; nontraumatic normocephalic EOMI PERRLA sclera anicteric, Set supple no JVD, Cardiovascular; regular rate and rhythm with S1-S2 no murmur rub or gallop, Pulmonary; clear bilaterally, Abdomen; soft nontender nondistended bowel sounds are active there is no palpable mass or hepatosplenomegaly, Rectal; exam no external lesion noted on anoscopy he does have somewhat friable internal hemorrhoids seen at the 4 to 5:00 position no fresh blood on the scope. Ext; no clubbing cyanosis or edema skin warm dry, Psych; mood and affect normal and appropriate        Assessment & Plan:  #81   58 year old male with rectal discomfort and intermittent low-volume hematochezia secondary to internal hemorrhoids.  #2 diverticulosis  #  3 chronic GERD -recent EGD with empiric dilation -no stricture noted   Plan ; start Anusol-HC suppositories at bedtime x7 days then as needed  Patient would like to definitive treatment of his hemorrhoids as he says he has had problems intermittently for some time. We discussed the O'Regan in office hemorrhoidal banding procedure and he would like to proceed with this. We'll schedule an appointment with Dr. Leone PayorGessner for hemorrhoid banding.

## 2013-12-29 NOTE — Telephone Encounter (Signed)
Patient states he felt "wet" at his rectum. He went to the bathroom and had dull, red blood on tissue. He reports rectal discomfort, burning. Denies abdominal pain or esophageal pain. Denies constipation. Patient had a endoscopy on 12/25/13. Scheduled OV with Mike GipAmy Esterwood, PA today at 3:30 PM.

## 2013-12-29 NOTE — Patient Instructions (Signed)
[facility] [address] for Anusol [facility] Suppositories.    We made an appointment to see Dr. Leone PayorGessner for Hemorrhoidal banding on Tues 01-02-2014 at 11:30 am. Brochures provided for the banding.

## 2013-12-29 NOTE — Telephone Encounter (Signed)
Patient coming today for OV.

## 2014-01-02 ENCOUNTER — Encounter: Payer: BC Managed Care – PPO | Admitting: Internal Medicine

## 2014-01-02 NOTE — Progress Notes (Signed)
Agree with Jerry Larson's assessment and plan. Jerry E. Gessner, MD, FACG   

## 2014-01-08 ENCOUNTER — Telehealth: Payer: Self-pay | Admitting: Internal Medicine

## 2014-01-08 DIAGNOSIS — R079 Chest pain, unspecified: Secondary | ICD-10-CM

## 2014-01-08 DIAGNOSIS — R1310 Dysphagia, unspecified: Secondary | ICD-10-CM

## 2014-01-08 DIAGNOSIS — K219 Gastro-esophageal reflux disease without esophagitis: Secondary | ICD-10-CM

## 2014-01-08 MED ORDER — PANTOPRAZOLE SODIUM 40 MG PO TBEC: 40.0000 mg | DELAYED_RELEASE_TABLET | Freq: Two times a day (BID) | ORAL | Status: DC

## 2014-01-08 NOTE — Telephone Encounter (Signed)
Informed patient that [facility].  Also moved his hemorrhoid banding appointment up to 01/17/14 at 11:15 AM.

## 2014-01-17 ENCOUNTER — Encounter: Payer: Self-pay | Admitting: Internal Medicine

## 2014-01-17 ENCOUNTER — Ambulatory Visit (INDEPENDENT_AMBULATORY_CARE_PROVIDER_SITE_OTHER): Payer: BC Managed Care – PPO | Admitting: Internal Medicine

## 2014-01-17 VITALS — BP 110/70 | HR 60 | Ht 65.0 in | Wt 211.0 lb

## 2014-01-17 DIAGNOSIS — K648 Other hemorrhoids: Secondary | ICD-10-CM

## 2014-01-17 DIAGNOSIS — K6289 Other specified diseases of anus and rectum: Secondary | ICD-10-CM

## 2014-01-17 NOTE — Patient Instructions (Signed)
Today you have been given a High Fiber Diet handout to read and follow.  Use benefiber as needed.  Information sheet given.  Your goal is to have easy, soft bowel movements.   Follow up with us as needed.   I appreciate the opportunity to care for you.

## 2014-01-17 NOTE — Progress Notes (Signed)
         Subjective:    Patient ID: Jerry Larson, male    DOB: September 20, 1956, 58 y.o.   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 He saw Mike GipAmy Esterwood, PA-C and was referred for possible hemorrhoid banding. He describes intermittent rectal bleeding that is often associated with painful defecation and intermittent rectal pain. He has been increasing fiber and water in diet of late and reports that these symptoms are much better since he saw us in Feb. He denies significant straining to stool and says bowel movements are formed and soft overall. He had a prescription for Anusol HC suppositories provided in Feb but did not fill because of cost.  Medications, allergies, past medical history, past surgical history, family history and social history are reviewed and updated in the EMR.  Review of Systems As above    Objective:   Physical Exam WDWN NAD  Rectal w/ male staff present:  Anoderm inspection revealed normal findings Digital exam revealed normal resting tone and voluntary squeeze. No mass or rectocele present.Formed brown stool and not tender. Simulated defecation with valsalva revealed appropriate abdominal contraction and descent.   Anoscopy was performed with the patient in the left lateral decubitus position while a chaperone was present and revealed Grade 1 internal hemorrhoids in all positions, no other abnormalities. No fissure.    Assessment & Plan:  Hemorrhoids, internal, with bleeding  Rectal pain  I think he has had an intermittent anal fissure causing the pain and perhaps some bleeding - with some bleeding from hemorrhoids. He seems to be responding to conservative measures so will not do ligation today. He is advised to continue a high fiber diet and is educated with handout and also to ass Benefiber prn.  He will return if his problems recur and we can reconsider Tx.  I appreciate  the opportunity to care for this patient.  Cc:SUN,VYVYAN Y, MD

## 2014-01-31 ENCOUNTER — Telehealth: Payer: Self-pay | Admitting: Internal Medicine

## 2014-01-31 NOTE — Telephone Encounter (Signed)
I was not able to speak with the patient According to [facility] patient with hemorrhoids that wanted to discuss hemorrhoidal banding I have left him a voicemail and scheduled him for tomorrow at 1:30 with Dr. Leone Payorgessner to discuss. I did ask him to call back tomorrow if this was not a good appt date and time.

## 2014-02-01 ENCOUNTER — Ambulatory Visit (INDEPENDENT_AMBULATORY_CARE_PROVIDER_SITE_OTHER): Payer: BC Managed Care – PPO | Admitting: Internal Medicine

## 2014-02-01 ENCOUNTER — Encounter: Payer: Self-pay | Admitting: Internal Medicine

## 2014-02-01 VITALS — BP 110/74 | HR 80 | Ht 65.0 in | Wt 205.0 lb

## 2014-02-01 DIAGNOSIS — K648 Other hemorrhoids: Secondary | ICD-10-CM

## 2014-02-01 NOTE — Patient Instructions (Signed)
HEMORRHOID BANDING PROCEDURE    FOLLOW-UP CARE   1. The procedure you have had should have been relatively painless since the banding of the area involved does not have nerve endings and there is no pain sensation.  The rubber band cuts off the blood supply to the hemorrhoid and the band may fall off as soon as 48 hours after the banding (the band may occasionally be seen in the toilet bowl following a bowel movement). You may notice a temporary feeling of fullness in the rectum which should respond adequately to plain Tylenol or Motrin.  2. Following the banding, avoid strenuous exercise that evening and resume full activity the next day.  A sitz bath (soaking in a warm tub) or bidet is soothing, and can be useful for cleansing the area after bowel movements.     3. To avoid constipation, take two tablespoons of natural wheat bran, natural oat bran, flax, Benefiber or any over the counter fiber supplement and increase your water intake to 7-8 glasses daily.    4. Unless you have been prescribed anorectal medication, do not put anything inside your rectum for two weeks: No suppositories, enemas, fingers, etc.  5. Occasionally, you may have more bleeding than usual after the banding procedure.  This is often from the untreated hemorrhoids rather than the treated one.  Don't be concerned if there is a tablespoon or so of blood.  If there is more blood than this, lie flat with your bottom higher than your head and apply an ice pack to the area. If the bleeding does not stop within a half an hour or if you feel faint, call our office at [phone] or go to the emergency room.  6. Problems are not common; however, if there is a substantial amount of bleeding, severe pain, chills, fever or difficulty passing urine (very rare) or other problems, you should call us at [phone] or report to the nearest emergency room.  7. Do not stay seated continuously for more than 2-3 hours for a day or two  after the procedure.  Tighten your buttock muscles 10-15 times every two hours and take 10-15 deep breaths every 1-2 hours.  Do not spend more than a few minutes on the toilet if you cannot empty your bowel; instead re-visit the toilet at a later time.    Today you have been given a handout to read and follow on benefiber.  Try to use at least 1 Tablespoon daily.   We will see you at your next banding appointment.  I appreciate the opportunity to care for you.

## 2014-02-01 NOTE — Progress Notes (Signed)
Patient ID: Jerry Larson, male   DOB: December 17, 1955, 58 y.o.   MRN: [id number]         Painless rectal bleeding has returned.  No fissure sxs  PROCEDURE NOTE: The patient presents with symptomatic grade 1  hemorrhoids, requesting rubber band ligation of his/her hemorrhoidal disease.  All risks, benefits and alternative forms of therapy were described and informed consent was obtained.  Rectal exam nontender   The decision was made to band the RP internal hemorrhoid, and the [facility] System was used to perform band ligation without complication.  Digital anorectal examination was then performed to assure proper positioning of the band, and to adjust the banded tissue as required.  The patient was discharged home without pain or other issues.  Dietary and behavioral recommendations were given and along with follow-up instructions.     The following adjunctive treatments were recommended:  Benefiber  The patient will return in 2-4 for  follow-up and possible additional banding as required. No complications were encountered and the patient tolerated the procedure well.

## 2014-02-02 ENCOUNTER — Encounter: Payer: BC Managed Care – PPO | Admitting: Cardiology

## 2014-02-02 NOTE — Progress Notes (Signed)
This encounter was created in error - please disregard.

## 2014-02-03 NOTE — Assessment & Plan Note (Signed)
RP column banded RTC 2-4 weeks  Benefiber

## 2014-02-25 ENCOUNTER — Encounter (HOSPITAL_COMMUNITY): Payer: Self-pay | Admitting: Emergency Medicine

## 2014-02-25 ENCOUNTER — Emergency Department (HOSPITAL_COMMUNITY): Payer: BC Managed Care – PPO

## 2014-02-25 ENCOUNTER — Emergency Department (HOSPITAL_COMMUNITY)
Admission: EM | Admit: 2014-02-25 | Discharge: 2014-02-25 | Disposition: A | Discharge status: Home / Self Care | Payer: BC Managed Care – PPO | Attending: Emergency Medicine | Admitting: Emergency Medicine

## 2014-02-25 DIAGNOSIS — E785 Hyperlipidemia, unspecified: Secondary | ICD-10-CM | POA: Insufficient documentation

## 2014-02-25 DIAGNOSIS — R531 Weakness: Secondary | ICD-10-CM

## 2014-02-25 DIAGNOSIS — R5383 Other fatigue: Principal | ICD-10-CM

## 2014-02-25 DIAGNOSIS — M109 Gout, unspecified: Secondary | ICD-10-CM | POA: Insufficient documentation

## 2014-02-25 DIAGNOSIS — R5381 Other malaise: Secondary | ICD-10-CM | POA: Insufficient documentation

## 2014-02-25 DIAGNOSIS — K219 Gastro-esophageal reflux disease without esophagitis: Secondary | ICD-10-CM | POA: Insufficient documentation

## 2014-02-25 DIAGNOSIS — Z79899 Other long term (current) drug therapy: Secondary | ICD-10-CM | POA: Insufficient documentation

## 2014-02-25 DIAGNOSIS — IMO0002 Reserved for concepts with insufficient information to code with codable children: Secondary | ICD-10-CM | POA: Insufficient documentation

## 2014-02-25 LAB — URINALYSIS, ROUTINE W REFLEX MICROSCOPIC
Bilirubin Urine: NEGATIVE
Glucose, UA: NEGATIVE mg/dL
Hgb urine dipstick: NEGATIVE
Ketones, ur: NEGATIVE mg/dL
Leukocytes, UA: NEGATIVE
NITRITE: NEGATIVE
Protein, ur: NEGATIVE mg/dL
SPECIFIC GRAVITY, URINE: 1.015 (ref 1.005–1.030)
UROBILINOGEN UA: 0.2 mg/dL (ref 0.0–1.0)
pH: 7 (ref 5.0–8.0)

## 2014-02-25 LAB — CBC WITH DIFFERENTIAL/PLATELET
Basophils Absolute: 0 10*3/uL (ref 0.0–0.1)
Basophils Relative: 0 % (ref 0–1)
Eosinophils Absolute: 0.2 10*3/uL (ref 0.0–0.7)
Eosinophils Relative: 2 % (ref 0–5)
HEMATOCRIT: 43.9 % (ref 39.0–52.0)
Hemoglobin: 14.9 g/dL (ref 13.0–17.0)
LYMPHS PCT: 44 % (ref 12–46)
Lymphs Abs: 3.1 10*3/uL (ref 0.7–4.0)
MCH: 29.6 pg (ref 26.0–34.0)
MCHC: 33.9 g/dL (ref 30.0–36.0)
MCV: 87.3 fL (ref 78.0–100.0)
MONO ABS: 0.6 10*3/uL (ref 0.1–1.0)
Monocytes Relative: 8 % (ref 3–12)
NEUTROS ABS: 3.3 10*3/uL (ref 1.7–7.7)
Neutrophils Relative %: 46 % (ref 43–77)
Platelets: 235 10*3/uL (ref 150–400)
RBC: 5.03 MIL/uL (ref 4.22–5.81)
RDW: 13.7 % (ref 11.5–15.5)
WBC: 7.2 10*3/uL (ref 4.0–10.5)

## 2014-02-25 LAB — BASIC METABOLIC PANEL
BUN: 11 mg/dL (ref 6–23)
CHLORIDE: 101 meq/L (ref 96–112)
CO2: 29 meq/L (ref 19–32)
CREATININE: 1.22 mg/dL (ref 0.50–1.35)
Calcium: 9.5 mg/dL (ref 8.4–10.5)
GFR calc Af Amer: 73 mL/min — ABNORMAL LOW (ref >90)
GFR calc non Af Amer: 63 mL/min — ABNORMAL LOW (ref >90)
Glucose, Bld: 98 mg/dL (ref 70–99)
Potassium: 4.6 mEq/L (ref 3.7–5.3)
SODIUM: 140 meq/L (ref 137–147)

## 2014-02-25 LAB — I-STAT TROPONIN, ED
TROPONIN I, POC: 0 ng/mL (ref 0.00–0.08)
TROPONIN I, POC: 0.08 ng/mL (ref 0.00–0.08)

## 2014-02-25 LAB — CBG MONITORING, ED: GLUCOSE-CAPILLARY: 94 mg/dL (ref 70–99)

## 2014-02-25 MED ORDER — SODIUM CHLORIDE 0.9 % IV BOLUS (SEPSIS)
1000.0000 mL | Freq: Once | INTRAVENOUS | Status: AC
  Administered 2014-02-25: 1000 mL via INTRAVENOUS

## 2014-02-25 NOTE — ED Provider Notes (Signed)
TIME SEEN: 12:50 PM  CHIEF COMPLAINT: Generalized weakness, fatigue  HPI: Patient is a 58 y.o. M with history of GERD, hyperlipidemia who presents to the emergency department stating he feels "drunk and hot". When asked to further characterize this, patient reports he feels "drained". He states he feels very weak all over. He denies any headache, head injury, neck pain and asked if this, fever chills, chest pain or shortness of breath, cough, vomiting or diarrhea, bloody stools or melena, numbness or tingling, focal weakness dizziness or vertigo. No sick contacts. He states he's had similar symptoms once before but states he is not sure he was seen by a physician for this. He states the symptoms started at 9:00 this morning when he was at church. Denies any aggravating or alleviating factors.  ROS: See HPI Constitutional: no fever  Eyes: no drainage  ENT: no runny nose   Cardiovascular:  no chest pain  Resp: no SOB  GI: no vomiting GU: no dysuria Integumentary: no rash  Allergy: no hives  Musculoskeletal: no leg swelling  Neurological: no slurred speech ROS otherwise negative  PAST MEDICAL HISTORY/PAST SURGICAL HISTORY:  Past Medical History  Diagnosis Date  . Hyperlipidemia   . Acid reflux   . Hiatal hernia   . Gout   . DDD (degenerative disc disease)   . Obesity   . Osteopenia   . Diverticulitis large intestine 09/2013    CT proven-sigmoid  . Anxiety     MEDICATIONS:  Prior to Admission medications   Medication Sig Start Date End Date Taking? Authorizing Provider  indomethacin (INDOCIN) 25 MG capsule Take 25 mg by mouth 2 (two) times daily as needed (for gout).   Yes Historical Provider, MD  pantoprazole (PROTONIX) 40 MG tablet Take 1 tablet (40 mg total) by mouth 2 (two) times daily. 01/08/14  Yes Mardella Laymanavid R Patterson, MD  pravastatin (PRAVACHOL) 40 MG tablet Take 40 mg by mouth daily.  12/08/13  Yes Historical Provider, MD    ALLERGIES:  Allergies  Allergen Reactions  .  Aspirin     Irritates stomach  . Lipitor [Atorvastatin Calcium] Other (See Comments)    Caused joint aches  . Zocor [Simvastatin] Other (See Comments)    Interfered with blood count    SOCIAL HISTORY:  History  Substance Use Topics  . Smoking status: Never Smoker   . Smokeless tobacco: Never Used  . Alcohol Use: No    FAMILY HISTORY: Family History  Problem Relation Age of Onset  . Pancreatic cancer Father   . Hypertension Mother   . Hypertension Sister   . Colon cancer Neg Hx   . Stomach cancer Neg Hx     EXAM: BP 124/64  Pulse 56  Temp(Src) 97.6 F (36.4 C) (Oral)  Resp 16  SpO2 100% CONSTITUTIONAL: Alert and oriented and responds appropriately to questions. Well-appearing; well-nourished HEAD: Normocephalic EYES: Conjunctivae clear, PERRL ENT: normal nose; no rhinorrhea; moist mucous membranes; pharynx without lesions noted NECK: Supple, no meningismus, no LAD  CARD: RRR; S1 and S2 appreciated; no murmurs, no clicks, no rubs, no gallops RESP: Normal chest excursion without splinting or tachypnea; breath sounds clear and equal bilaterally; no wheezes, no rhonchi, no rales,  ABD/GI: Normal bowel sounds; non-distended; soft, non-tender, no rebound, no guarding BACK:  The back appears normal and is non-tender to palpation, there is no CVA tenderness EXT: Normal ROM in all joints; non-tender to palpation; no edema; normal capillary refill; no cyanosis    SKIN: Normal color  for age and race; warm NEURO: Moves all extremities equally, strength 5/5 in all 4 extremity, sensation to light touch intact diffusely, cranial nerves II through XII intact PSYCH: The patient's mood and manner are appropriate. Grooming and personal hygiene are appropriate.  MEDICAL DECISION MAKING: Patient here with very vague symptoms of generalized weakness without any other associated symptoms. Differential diagnosis includes anemia, electrolyte abnormality, dehydration, infection, ACS. We'll obtain  basic labs, chest x-ray and urine, EKG and troponin. We will give IV fluids and reassess.  ED PROGRESS:   Patient's initial labs are unremarkable. Chest x-ray shows no infiltrate. Urine shows no sign of infection. First troponin negative. We'll repeat a second troponin at 6 hours after onset of symptoms.   3:30 PM  Pt's 2nd troponin 6 hours after the onset of symptoms is negative. Patient reports he feels much better after IV fluids. Have discussed with patient that I recommend he followup with his primary care physician this week. Have discussed strict return precautions. Patient verbalizes understanding is comfortable plan. He is still hemodynamically stable he denies any episodes of chest pain, shortness of breath, palpitations, lightheadedness, vertigo, neurologic deficits, diaphoresis.     EKG Interpretation  Date/Time:  Sunday February 25 2014 12:12:50 EDT Ventricular Rate:  57 PR Interval:  139 QRS Duration: 97 QT Interval:  425 QTC Calculation: 414 R Axis:   8 Text Interpretation:  Sinus rhythm RSR' in V1 or V2, right VCD or RVH No significant change since last tracing Confirmed by WARD,  DO, KRISTEN [phone]) on 02/25/2014 1:10:54 PM        Layla Maw Ward, DO 02/25/14 1558

## 2014-02-25 NOTE — Discharge Instructions (Signed)
Fatigue °Fatigue is a feeling of tiredness, lack of energy, lack of motivation, or feeling tired all the time. Having enough rest, good nutrition, and reducing stress will normally reduce fatigue. Consult your caregiver if it persists. The nature of your fatigue will help your caregiver to find out its cause. The treatment is based on the cause.  °CAUSES  °There are many causes for fatigue. Most of the time, fatigue can be traced to one or more of your habits or routines. Most causes fit into one or more of three general areas. They are: °Lifestyle problems °· Sleep disturbances. °· Overwork. °· Physical exertion. °· Unhealthy habits. °· Poor eating habits or eating disorders. °· Alcohol and/or drug use . °· Lack of proper nutrition (malnutrition). °Psychological problems °· Stress and/or anxiety problems. °· Depression. °· Grief. °· Boredom. °Medical Problems or Conditions °· Anemia. °· Pregnancy. °· Thyroid gland problems. °· Recovery from major surgery. °· Continuous pain. °· Emphysema or asthma that is not well controlled °· Allergic conditions. °· Diabetes. °· Infections (such as mononucleosis). °· Obesity. °· Sleep disorders, such as sleep apnea. °· Heart failure or other heart-related problems. °· Cancer. °· Kidney disease. °· Liver disease. °· Effects of certain medicines such as antihistamines, cough and cold remedies, prescription pain medicines, heart and blood pressure medicines, drugs used for treatment of cancer, and some antidepressants. °SYMPTOMS  °The symptoms of fatigue include:  °· Lack of energy. °· Lack of drive (motivation). °· Drowsiness. °· Feeling of indifference to the surroundings. °DIAGNOSIS  °The details of how you feel help guide your caregiver in finding out what is causing the fatigue. You will be asked about your present and past health condition. It is important to review all medicines that you take, including prescription and non-prescription items. A thorough exam will be done.  You will be questioned about your feelings, habits, and normal lifestyle. Your caregiver may suggest blood tests, urine tests, or other tests to look for common medical causes of fatigue.  °TREATMENT  °Fatigue is treated by correcting the underlying cause. For example, if you have continuous pain or depression, treating these causes will improve how you feel. Similarly, adjusting the dose of certain medicines will help in reducing fatigue.  °HOME CARE INSTRUCTIONS  °· Try to get the required amount of good sleep every night. °· Eat a healthy and nutritious diet, and drink enough water throughout the day. °· Practice ways of relaxing (including yoga or meditation). °· Exercise regularly. °· Make plans to change situations that cause stress. Act on those plans so that stresses decrease over time. Keep your work and personal routine reasonable. °· Avoid street drugs and minimize use of alcohol. °· Start taking a daily multivitamin after consulting your caregiver. °SEEK MEDICAL CARE IF:  °· You have persistent tiredness, which cannot be accounted for. °· You have fever. °· You have unintentional weight loss. °· You have headaches. °· You have disturbed sleep throughout the night. °· You are feeling sad. °· You have constipation. °· You have dry skin. °· You have gained weight. °· You are taking any new or different medicines that you suspect are causing fatigue. °· You are unable to sleep at night. °· You develop any unusual swelling of your legs or other parts of your body. °SEEK IMMEDIATE MEDICAL CARE IF:  °· You are feeling confused. °· Your vision is blurred. °· You feel faint or pass out. °· You develop severe headache. °· You develop severe abdominal, pelvic, or   back pain.  You develop chest pain, shortness of breath, or an irregular or fast heartbeat.  You are unable to pass a normal amount of urine.  You develop abnormal bleeding such as bleeding from the rectum or you vomit blood.  You have thoughts  about harming yourself or committing suicide.  You are worried that you might harm someone else. MAKE SURE YOU:   Understand these instructions.  Will watch your condition.  Will get help right away if you are not doing well or get worse. Document Released: 08/30/2007 Document Revised: 01/25/2012 Document Reviewed: 08/30/2007 [facility] Patient Information 2014 [place], [place].  Weakness Weakness is a lack of strength. It may be felt all over the body (generalized) or in one specific part of the body (focal). Some causes of weakness can be serious. You may need further medical evaluation, especially if you are elderly or you have a history of immunosuppression (such as chemotherapy or HIV), kidney disease, heart disease, or diabetes. CAUSES  Weakness can be caused by many different things, including:  Infection.  Physical exhaustion.  Internal bleeding or other blood loss that results in a lack of red blood cells (anemia).  Dehydration. This cause is more common in elderly people.  Side effects or electrolyte abnormalities from medicines, such as pain medicines or sedatives.  Emotional distress, anxiety, or depression.  Circulation problems, especially severe peripheral arterial disease.  Heart disease, such as rapid atrial fibrillation, bradycardia, or heart failure.  Nervous system disorders, such as Guillain-Barr syndrome, multiple sclerosis, or stroke. DIAGNOSIS  To find the cause of your weakness, your caregiver will take your history and perform a physical exam. Lab tests or X-rays may also be ordered, if needed. TREATMENT  Treatment of weakness depends on the cause of your symptoms and can vary greatly. HOME CARE INSTRUCTIONS   Rest as needed.  Eat a well-balanced diet.  Try to get some exercise every day.  Only take over-the-counter or prescription medicines as directed by your caregiver. SEEK MEDICAL CARE IF:   Your weakness seems to be getting worse or  spreads to other parts of your body.  You develop new aches or pains. SEEK IMMEDIATE MEDICAL CARE IF:   You cannot perform your normal daily activities, such as getting dressed and feeding yourself.  You cannot walk up and down stairs, or you feel exhausted when you do so.  You have shortness of breath or chest pain.  You have difficulty moving parts of your body.  You have weakness in only one area of the body or on only one side of the body.  You have a fever.  You have trouble speaking or swallowing.  You cannot control your bladder or bowel movements.  You have black or bloody vomit or stools. MAKE SURE YOU:  Understand these instructions.  Will watch your condition.  Will get help right away if you are not doing well or get worse. Document Released: 11/02/2005 Document Revised: 05/03/2012 Document Reviewed: 01/01/2012 [facility] Patient Information 2014 [place], [place]. Possible Dehydration, Adult Dehydration is when you lose more fluids from the body than you take in. Vital organs like the kidneys, brain, and heart cannot function without a proper amount of fluids and salt. Any loss of fluids from the body can cause dehydration.  CAUSES   Vomiting.  Diarrhea.  Excessive sweating.  Excessive urine output.  Fever. SYMPTOMS  Mild dehydration  Thirst.  Dry lips.  Slightly dry mouth. Moderate dehydration  Very dry mouth.  Sunken eyes.  Skin does not bounce back quickly when lightly pinched and released.  Dark urine and decreased urine production.  Decreased tear production.  Headache. Severe dehydration  Very dry mouth.  Extreme thirst.  Rapid, weak pulse (more than 100 beats per minute at rest).  Cold hands and feet.  Not able to sweat in spite of heat and temperature.  Rapid breathing.  Blue lips.  Confusion and lethargy.  Difficulty being awakened.  Minimal urine production.  No tears. DIAGNOSIS  Your caregiver will diagnose  dehydration based on your symptoms and your exam. Blood and urine tests will help confirm the diagnosis. The diagnostic evaluation should also identify the cause of dehydration. TREATMENT  Treatment of mild or moderate dehydration can often be done at home by increasing the amount of fluids that you drink. It is best to drink small amounts of fluid more often. Drinking too much at one time can make vomiting worse. Refer to the home care instructions below. Severe dehydration needs to be treated at the hospital where you will probably be given intravenous (IV) fluids that contain water and electrolytes. HOME CARE INSTRUCTIONS   Ask your caregiver about specific rehydration instructions.  Drink enough fluids to keep your urine clear or pale yellow.  Drink small amounts frequently if you have nausea and vomiting.  Eat as you normally do.  Avoid:  Foods or drinks high in sugar.  Carbonated drinks.  Juice.  Extremely hot or cold fluids.  Drinks with caffeine.  Fatty, greasy foods.  Alcohol.  Tobacco.  Overeating.  Gelatin desserts.  Wash your hands well to avoid spreading bacteria and viruses.  Only take over-the-counter or prescription medicines for pain, discomfort, or fever as directed by your caregiver.  Ask your caregiver if you should continue all prescribed and over-the-counter medicines.  Keep all follow-up appointments with your caregiver. SEEK MEDICAL CARE IF:  You have abdominal pain and it increases or stays in one area (localizes).  You have a rash, stiff neck, or severe headache.  You are irritable, sleepy, or difficult to awaken.  You are weak, dizzy, or extremely thirsty. SEEK IMMEDIATE MEDICAL CARE IF:   You are unable to keep fluids down or you get worse despite treatment.  You have frequent episodes of vomiting or diarrhea.  You have blood or green matter (bile) in your vomit.  You have blood in your stool or your stool looks black and  tarry.  You have not urinated in 6 to 8 hours, or you have only urinated a small amount of very dark urine.  You have a fever.  You faint. MAKE SURE YOU:   Understand these instructions.  Will watch your condition.  Will get help right away if you are not doing well or get worse. Document Released: 11/02/2005 Document Revised: 01/25/2012 Document Reviewed: 06/22/2011 [facility] Patient [facility] 2014 [place], [place].

## 2014-02-25 NOTE — ED Notes (Signed)
He describes feeling "drunk and hot"this morning.  He was able to drive, and recalls what he ate at a restaurant this morning.  He states he has felt this way before, and does not recall his diagnosis.  He denies any new meds. And he denies any hx of d.m.  He states he is on meds for reflux and cholesterol, and for gout p.r.n.

## 2014-02-25 NOTE — ED Notes (Signed)
MD at bedside. 

## 2014-02-27 ENCOUNTER — Ambulatory Visit (INDEPENDENT_AMBULATORY_CARE_PROVIDER_SITE_OTHER): Payer: BC Managed Care – PPO | Admitting: Internal Medicine

## 2014-02-27 ENCOUNTER — Encounter: Payer: BC Managed Care – PPO | Admitting: Internal Medicine

## 2014-02-27 ENCOUNTER — Encounter: Payer: Self-pay | Admitting: Internal Medicine

## 2014-02-27 VITALS — BP 122/76 | HR 68 | Ht 65.0 in | Wt 213.2 lb

## 2014-02-27 DIAGNOSIS — K648 Other hemorrhoids: Secondary | ICD-10-CM

## 2014-02-27 NOTE — Progress Notes (Signed)
    HPI  Here for f/u of hemorrhoids, repeat ligation, s/p RP ligation 3/19. Spell of rectal pain x 1-2 days - gone, no bleeding and no constipation or straining.  PROCEDURE NOTE: The patient presents with symptomatic grade 1  hemorrhoids, requesting rubber band ligation of his/her hemorrhoidal disease.  All risks, benefits and alternative forms of therapy were described and informed consent was obtained.  Rectal exam showed normal anoderm, normal tone, no mass, and non-tender  The anorectum was pre-medicated with 0.125% NTG and 5% lidocaine The decision was made to band the RA and LL internal hemorrhoids, and the [facility] O'Regan System was used to perform band ligation without complication.  Digital anorectal examination was then performed to assure proper positioning of the band, and to adjust the banded tissue as required.  The patient was discharged home without pain or other issues.  Dietary and behavioral recommendations were given and along with follow-up instructions.     The following adjunctive treatments were recommended:  Benefiber as before  The patient will follow-up as needed. No complications were encountered and the patient tolerated the procedure well.  I appreciate the opportunity to care for this patient.  Cc:SUN,VYVYAN Y, MD

## 2014-02-27 NOTE — Assessment & Plan Note (Signed)
LL and RA banded We discussed slight increase risk of bleeding and pain with banding 2 piles. He preferred to try 2 today. RTC 2 mon

## 2014-02-27 NOTE — Patient Instructions (Signed)
HEMORRHOID BANDING PROCEDURE    FOLLOW-UP CARE   1. The procedure you have had should have been relatively painless since the banding of the area involved does not have nerve endings and there is no pain sensation.  The rubber band cuts off the blood supply to the hemorrhoid and the band may fall off as soon as 48 hours after the banding (the band may occasionally be seen in the toilet bowl following a bowel movement). You may notice a temporary feeling of fullness in the rectum which should respond adequately to plain Tylenol or Motrin.  2. Following the banding, avoid strenuous exercise that evening and resume full activity the next day.  A sitz bath (soaking in a warm tub) or bidet is soothing, and can be useful for cleansing the area after bowel movements.     3. To avoid constipation, take two tablespoons of natural wheat bran, natural oat bran, flax, Benefiber or any over the counter fiber supplement and increase your water intake to 7-8 glasses daily.    4. Unless you have been prescribed anorectal medication, do not put anything inside your rectum for two weeks: No suppositories, enemas, fingers, etc.  5. Occasionally, you may have more bleeding than usual after the banding procedure.  This is often from the untreated hemorrhoids rather than the treated one.  Don't be concerned if there is a tablespoon or so of blood.  If there is more blood than this, lie flat with your bottom higher than your head and apply an ice pack to the area. If the bleeding does not stop within a half an hour or if you feel faint, call our office at [phone] or go to the emergency room.  6. Problems are not common; however, if there is a substantial amount of bleeding, severe pain, chills, fever or difficulty passing urine (very rare) or other problems, you should call us at [phone] or report to the nearest emergency room.  7. Do not stay seated continuously for more than 2-3 hours for a day or two  after the procedure.  Tighten your buttock muscles 10-15 times every two hours and take 10-15 deep breaths every 1-2 hours.  Do not spend more than a few minutes on the toilet if you cannot empty your bowel; instead re-visit the toilet at a later time.    If your still having problems in mid June call us back.   I appreciate the opportunity to care for you.

## 2014-03-06 ENCOUNTER — Telehealth: Payer: Self-pay | Admitting: Internal Medicine

## 2014-03-06 MED ORDER — AMBULATORY NON FORMULARY MEDICATION: Status: DC

## 2014-03-06 NOTE — Telephone Encounter (Signed)
Patient notified  REV scheduled for 04/23/14

## 2014-03-06 NOTE — Telephone Encounter (Signed)
He also had pain before initial banding  He should get 0.125% NTG ointment and appy 4x/day #30 g and 1 refill  If not helping him after 1 week call back  Needs REV in 1 month  If fever, bleeding other than minor) other sxs call back sooner

## 2014-03-06 NOTE — Telephone Encounter (Signed)
Patient has had pain that started 2 days after hemorrhoid banding.  He is wondering how long he should expect to have pain?  Do you want to see him?  He has no bleeding

## 2014-03-15 ENCOUNTER — Encounter (HOSPITAL_COMMUNITY): Payer: Self-pay | Admitting: Emergency Medicine

## 2014-03-15 ENCOUNTER — Emergency Department (HOSPITAL_COMMUNITY)
Admission: EM | Admit: 2014-03-15 | Discharge: 2014-03-15 | Disposition: A | Discharge status: Home / Self Care | Payer: BC Managed Care – PPO | Attending: Emergency Medicine | Admitting: Emergency Medicine

## 2014-03-15 ENCOUNTER — Emergency Department (HOSPITAL_COMMUNITY): Payer: BC Managed Care – PPO

## 2014-03-15 DIAGNOSIS — M545 Low back pain, unspecified: Secondary | ICD-10-CM | POA: Insufficient documentation

## 2014-03-15 DIAGNOSIS — K219 Gastro-esophageal reflux disease without esophagitis: Secondary | ICD-10-CM | POA: Insufficient documentation

## 2014-03-15 DIAGNOSIS — M949 Disorder of cartilage, unspecified: Secondary | ICD-10-CM

## 2014-03-15 DIAGNOSIS — K5732 Diverticulitis of large intestine without perforation or abscess without bleeding: Secondary | ICD-10-CM | POA: Insufficient documentation

## 2014-03-15 DIAGNOSIS — M109 Gout, unspecified: Secondary | ICD-10-CM | POA: Insufficient documentation

## 2014-03-15 DIAGNOSIS — Z79899 Other long term (current) drug therapy: Secondary | ICD-10-CM | POA: Insufficient documentation

## 2014-03-15 DIAGNOSIS — IMO0002 Reserved for concepts with insufficient information to code with codable children: Secondary | ICD-10-CM | POA: Insufficient documentation

## 2014-03-15 DIAGNOSIS — E669 Obesity, unspecified: Secondary | ICD-10-CM | POA: Insufficient documentation

## 2014-03-15 DIAGNOSIS — M549 Dorsalgia, unspecified: Secondary | ICD-10-CM

## 2014-03-15 DIAGNOSIS — M899 Disorder of bone, unspecified: Secondary | ICD-10-CM | POA: Insufficient documentation

## 2014-03-15 DIAGNOSIS — M546 Pain in thoracic spine: Secondary | ICD-10-CM | POA: Insufficient documentation

## 2014-03-15 DIAGNOSIS — K449 Diaphragmatic hernia without obstruction or gangrene: Secondary | ICD-10-CM | POA: Insufficient documentation

## 2014-03-15 LAB — BASIC METABOLIC PANEL
BUN: 12 mg/dL (ref 6–23)
CALCIUM: 9.4 mg/dL (ref 8.4–10.5)
CO2: 24 mEq/L (ref 19–32)
CREATININE: 1.04 mg/dL (ref 0.50–1.35)
Chloride: 100 mEq/L (ref 96–112)
GFR, EST AFRICAN AMERICAN: 88 mL/min — AB (ref >90)
GFR, EST NON AFRICAN AMERICAN: 76 mL/min — AB (ref >90)
Glucose, Bld: 119 mg/dL — ABNORMAL HIGH (ref 70–99)
POTASSIUM: 4.9 meq/L (ref 3.7–5.3)
Sodium: 136 mEq/L — ABNORMAL LOW (ref 137–147)

## 2014-03-15 LAB — I-STAT CHEM 8, ED
BUN: 12 mg/dL (ref 6–23)
CREATININE: 1.2 mg/dL (ref 0.50–1.35)
Calcium, Ion: 1.2 mmol/L (ref 1.13–1.30)
Chloride: 101 mEq/L (ref 96–112)
Glucose, Bld: 119 mg/dL — ABNORMAL HIGH (ref 70–99)
HCT: 51 % (ref 39.0–52.0)
HEMOGLOBIN: 17.3 g/dL — AB (ref 13.0–17.0)
Potassium: 4.7 mEq/L (ref 3.7–5.3)
Sodium: 139 mEq/L (ref 137–147)
TCO2: 24 mmol/L (ref 0–100)

## 2014-03-15 LAB — URINALYSIS, ROUTINE W REFLEX MICROSCOPIC
BILIRUBIN URINE: NEGATIVE
Glucose, UA: NEGATIVE mg/dL
HGB URINE DIPSTICK: NEGATIVE
Ketones, ur: NEGATIVE mg/dL
Leukocytes, UA: NEGATIVE
Nitrite: NEGATIVE
PROTEIN: NEGATIVE mg/dL
Specific Gravity, Urine: 1.012 (ref 1.005–1.030)
UROBILINOGEN UA: 0.2 mg/dL (ref 0.0–1.0)
pH: 6.5 (ref 5.0–8.0)

## 2014-03-15 LAB — CBC WITH DIFFERENTIAL/PLATELET
BASOS PCT: 0 % (ref 0–1)
Basophils Absolute: 0 10*3/uL (ref 0.0–0.1)
EOS PCT: 1 % (ref 0–5)
Eosinophils Absolute: 0 10*3/uL (ref 0.0–0.7)
HCT: 47.3 % (ref 39.0–52.0)
Hemoglobin: 15.5 g/dL (ref 13.0–17.0)
Lymphocytes Relative: 20 % (ref 12–46)
Lymphs Abs: 1.3 10*3/uL (ref 0.7–4.0)
MCH: 29.1 pg (ref 26.0–34.0)
MCHC: 32.8 g/dL (ref 30.0–36.0)
MCV: 88.7 fL (ref 78.0–100.0)
Monocytes Absolute: 0.1 10*3/uL (ref 0.1–1.0)
Monocytes Relative: 2 % — ABNORMAL LOW (ref 3–12)
NEUTROS PCT: 77 % (ref 43–77)
Neutro Abs: 5 10*3/uL (ref 1.7–7.7)
PLATELETS: 214 10*3/uL (ref 150–400)
RBC: 5.33 MIL/uL (ref 4.22–5.81)
RDW: 13.8 % (ref 11.5–15.5)
WBC: 6.5 10*3/uL (ref 4.0–10.5)

## 2014-03-15 LAB — I-STAT TROPONIN, ED: Troponin i, poc: 0 ng/mL (ref 0.00–0.08)

## 2014-03-15 MED ORDER — OXYCODONE-ACETAMINOPHEN 5-325 MG PO TABS: 1.0000 | ORAL_TABLET | ORAL | Status: DC | PRN

## 2014-03-15 MED ORDER — HYDROMORPHONE HCL PF 1 MG/ML IJ SOLN
1.0000 mg | Freq: Once | INTRAMUSCULAR | Status: AC
  Administered 2014-03-15: 1 mg via INTRAVENOUS
  Filled 2014-03-15: qty 1

## 2014-03-15 MED ORDER — SODIUM CHLORIDE 0.9 % IV SOLN
INTRAVENOUS | Status: DC
  Administered 2014-03-15: 10:00:00 via INTRAVENOUS

## 2014-03-15 MED ORDER — SODIUM CHLORIDE 0.9 % IV BOLUS (SEPSIS)
500.0000 mL | Freq: Once | INTRAVENOUS | Status: AC
  Administered 2014-03-15: 500 mL via INTRAVENOUS

## 2014-03-15 MED ORDER — METHYLPREDNISOLONE SODIUM SUCC 125 MG IJ SOLR
125.0000 mg | Freq: Once | INTRAMUSCULAR | Status: AC
  Administered 2014-03-15: 125 mg via INTRAVENOUS
  Filled 2014-03-15: qty 2

## 2014-03-15 MED ORDER — DIAZEPAM 5 MG/ML IJ SOLN
5.0000 mg | Freq: Once | INTRAMUSCULAR | Status: AC
  Administered 2014-03-15: 5 mg via INTRAVENOUS
  Filled 2014-03-15: qty 2

## 2014-03-15 MED ORDER — IOHEXOL 350 MG/ML SOLN
100.0000 mL | Freq: Once | INTRAVENOUS | Status: AC | PRN
  Administered 2014-03-15: 100 mL via INTRAVENOUS

## 2014-03-15 MED ORDER — DIAZEPAM 5 MG PO TABS: 5.0000 mg | ORAL_TABLET | Freq: Two times a day (BID) | ORAL | Status: DC

## 2014-03-15 NOTE — ED Provider Notes (Signed)
Medical screening examination/treatment/procedure(s) were conducted as a shared visit with non-physician practitioner(s) and myself.  I personally evaluated the patient during the encounter.  L paraspinal thoracic and lumbar pain that started while lying in bed. Denies injury.  No chest pain, abdominal pain or shortness of breath. No incontinence, weakness, numbness, tingling. Told PA he has chronic back pain and worked out yesterday but denies this to me.  EKG with T wave flattening laterally similar to October 2014.  5/5 strength in bilateral lower extremities. Ankle plantar and dorsiflexion intact. Great toe extension intact bilaterally. +2 DP and PT pulses. +2 patellar reflexes bilaterally. Normal gait.     EKG Interpretation   Date/Time:  Thursday March 15 2014 15:06:59 EDT Ventricular Rate:  65 PR Interval:  147 QRS Duration: 97 QT Interval:  407 QTC Calculation: 423 R Axis:   -12 Text Interpretation:  Sinus rhythm Probable left atrial enlargement RSR'  in V1 or V2, right VCD or RVH Borderline T abnormalities, lateral leads  lateral T wave flattening Confirmed by Manus GunningANCOUR  MD, Caroleena Paolini [phone]([id number]) on  03/15/2014 4:43:26 PM       Glynn OctaveStephen Morrison Masser, MD 03/15/14 1726

## 2014-03-15 NOTE — ED Notes (Signed)
Vital signs stable. 

## 2014-03-15 NOTE — Discharge Instructions (Signed)
Back Pain, Adult  Back pain is very common. The pain often gets better over time. The cause of back pain is usually not dangerous. Most people can learn to manage their back pain on their own.   HOME CARE   · Stay active. Start with short walks on flat ground if you can. Try to walk farther each day.  · Do not sit, drive, or stand in one place for more than 30 minutes. Do not stay in bed.  · Do not avoid exercise or work. Activity can help your back heal faster.  · Be careful when you bend or lift an object. Bend at your knees, keep the object close to you, and do not twist.  · Sleep on a firm mattress. Lie on your side, and bend your knees. If you lie on your back, put a pillow under your knees.  · Only take medicines as told by your doctor.  · Put ice on the injured area.  · Put ice in a plastic bag.  · Place a towel between your skin and the bag.  · Leave the ice on for 15-20 minutes, 03-04 times a day for the first 2 to 3 days. After that, you can switch between ice and heat packs.  · Ask your doctor about back exercises or massage.  · Avoid feeling anxious or stressed. Find good ways to deal with stress, such as exercise.  GET HELP RIGHT AWAY IF:   · Your pain does not go away with rest or medicine.  · Your pain does not go away in 1 week.  · You have new problems.  · You do not feel well.  · The pain spreads into your legs.  · You cannot control when you poop (bowel movement) or pee (urinate).  · Your arms or legs feel weak or lose feeling (numbness).  · You feel sick to your stomach (nauseous) or throw up (vomit).  · You have belly (abdominal) pain.  · You feel like you may pass out (faint).  MAKE SURE YOU:   · Understand these instructions.  · Will watch your condition.  · Will get help right away if you are not doing well or get worse.  Document Released: 04/20/2008 Document Revised: 01/25/2012 Document Reviewed: 03/23/2011  [employer]® Patient Information ©2014 [employer].

## 2014-03-15 NOTE — ED Notes (Signed)
Pt belongings (clothing, shoes, and cell phone) placed in pt stretcher in halllway. Pt made aware.

## 2014-03-15 NOTE — ED Provider Notes (Signed)
CSN: [id number]     Arrival date & time 03/15/14  0272 History   First MD Initiated Contact with Patient 03/15/14 [phone]     Chief Complaint  Patient presents with  . Back Pain     (Consider location/radiation/quality/duration/timing/severity/associated sxs/prior Treatment) Patient is a 58 y.o. male presenting with back pain. The history is provided by the patient.  Back Pain Location:  Thoracic spine and lumbar spine Quality:  Stabbing Radiates to:  Does not radiate Pain severity:  Severe Pain is:  Same all the time Onset quality:  Sudden Duration:  3 hours Timing:  Intermittent Progression:  Unchanged Chronicity:  Recurrent Relieved by:  Bed rest Ineffective treatments:  None tried Associated symptoms: no abdominal pain, no bladder incontinence, no fever, no leg pain, no numbness, no perianal numbness and no weakness     Patient to the ER by EMS with severe midline mid/low back pain that started acutely this morning at 6am. He reports that he was already awake and simply laying in bed when it started. The pains are sharp and intermittent. He describes it as a pulling and a "tiresome" feeling. He denies pain or weakness to his lower extremities. No numbness. He had a hemorrhoidectomy recently, he has had no complications from that, last had a normal painless bowel movement around 7 am this morning. No abnormalities with urinating. No abdominal pain, nausea, vomiting, diarrhea. He has a hx of back pain but says this is much worse than anything he has felt. He reports that he worked out at [employer] yesterday. He did upper arm and upper back exercises.    Past Medical History  Diagnosis Date  . Hyperlipidemia   . Acid reflux   . Hiatal hernia   . Gout   . DDD (degenerative disc disease)   . Obesity   . Osteopenia   . Diverticulitis large intestine 09/2013    CT proven-sigmoid  . Anxiety    Past Surgical History  Procedure Laterality Date  . Colonoscopy    .  Esophagogastroduodenoscopy  12/25/13  . Hemorrhoid banding     Family History  Problem Relation Age of Onset  . Pancreatic cancer Father   . Hypertension Mother   . Hypertension Sister   . Colon cancer Neg Hx   . Stomach cancer Neg Hx    History  Substance Use Topics  . Smoking status: Never Smoker   . Smokeless tobacco: Never Used  . Alcohol Use: No    Review of Systems  Constitutional: Negative for fever.  Gastrointestinal: Negative for abdominal pain.  Genitourinary: Negative for bladder incontinence.  Musculoskeletal: Positive for back pain.  Neurological: Negative for weakness and numbness.      Allergies  Aspirin; Lipitor; and Zocor  Home Medications   Prior to Admission medications   Medication Sig Start Date End Date Taking? Authorizing Provider  AMBULATORY NON FORMULARY MEDICATION Medication Name: Nitroglycerine ointment 0.125 % .  Apply a pea size amount in rectum 4 times a day 03/06/14   Iva Boop, MD  indomethacin (INDOCIN) 25 MG capsule Take 25 mg by mouth 2 (two) times daily as needed (for gout).    Historical Provider, MD  meloxicam (MOBIC) 7.5 MG tablet  01/15/14   Historical Provider, MD  pantoprazole (PROTONIX) 40 MG tablet Take 1 tablet (40 mg total) by mouth 2 (two) times daily. 01/08/14   Mardella Layman, MD  pravastatin (PRAVACHOL) 40 MG tablet Take 40 mg by mouth daily.  12/08/13  Historical Provider, MD   BP 124/71  Pulse 58  Temp(Src) 97.2 F (36.2 C) (Oral)  Resp 20  Ht 5\' 5"  (1.651 m)  Wt 216 lb (97.977 kg)  BMI 35.94 kg/m2  SpO2 93% Physical Exam  Nursing note and vitals reviewed. Constitutional: He appears well-developed and well-nourished. No distress.  HENT:  Head: Normocephalic and atraumatic.  Eyes: Pupils are equal, round, and reactive to light.  Neck: Normal range of motion. Neck supple.  Cardiovascular: Normal rate and regular rhythm.   Pulmonary/Chest: Effort normal.  Abdominal: Soft.  Musculoskeletal:  Pt has equal  strength to bilateral lower extremities.  Neurosensory function adequate to both legs No clonus on dorsiflextion Skin color is normal. Skin is warm and moist.  I see no step off deformity, no midline bony tenderness.  Pt is able to ambulate.  No crepitus, laceration, effusion, induration, lesions, swelling.   Pedal pulses are symmetrical and palpable bilaterally  Thoracic and lumbar tenderness to palpation of paraspinel muscles   Neurological: He is alert.  Skin: Skin is warm and dry.    ED Course  Procedures (including critical care time) Labs Review Labs Reviewed  CBC WITH DIFFERENTIAL - Abnormal; Notable for the following:    Monocytes Relative 2 (*)    All other components within normal limits  BASIC METABOLIC PANEL - Abnormal; Notable for the following:    Sodium 136 (*)    Glucose, Bld 119 (*)    GFR calc non Af Amer 76 (*)    GFR calc Af Amer 88 (*)    All other components within normal limits  I-STAT CHEM 8, ED - Abnormal; Notable for the following:    Glucose, Bld 119 (*)    Hemoglobin 17.3 (*)    All other components within normal limits  URINALYSIS, ROUTINE W REFLEX MICROSCOPIC    Imaging Review Dg Thoracic Spine 2 View  03/15/2014   CLINICAL DATA:  Midline pain from mid thoracic to mid lumbar  EXAM: THORACIC SPINE - 2 VIEW  COMPARISON:  None.  FINDINGS: There is no evidence of thoracic spine fracture. Alignment is normal. No other significant bone abnormalities are identified.  Partially visualized is lower cervical spondylosis.  IMPRESSION: No acute osseous injury of the thoracic spine.   Electronically Signed   By: Elige KoHetal  Patel   On: 03/15/2014 12:44   Dg Lumbar Spine Complete  03/15/2014   CLINICAL DATA:  Pain from the mid thoracic spine to the mid lumbar spine.  EXAM: LUMBAR SPINE - COMPLETE 4+ VIEW  COMPARISON:  CT 10/15/2013 and lumbar radiographs 07/02/2011  FINDINGS: AP, lateral and oblique images the lumbar spine were obtained. Alignment of the lumbar spine  is normal. Mild degenerative endplate changes with some bridging osteophytes. The vertebral body heights are maintained. The endplate changes have not significantly changed. Mild disc space narrowing at L4-L5. No evidence for a pars defect. Nonspecific bowel gas pattern.  IMPRESSION: Stable degenerative changes in the lumbar spine. No acute bone abnormality.   Electronically Signed   By: Richarda OverlieAdam  Henn M.D.   On: 03/15/2014 12:46     EKG Interpretation None      MDM   Final diagnoses:  None    Patients pain has been easily controlled with IV steroids and IV pain medications. He continues to endorse pain with walking and certain movements. Dr. Manus Gunningancour saw patient and would like to make sure that he does not have an aneurysm. Due to the acuteness, severity and newness of  the pains.   3:23 pm- pt moved to Pod C to await scans. It is end of the shift and I have re-evaluated him. He is currently pain free and understands that he is waiting for his scans and is still on board. Sign out to [employer] PartsShari Upstill, PA-C    Dorthula Matasiffany G Lorry Furber, PA-C 03/15/14 1524

## 2014-03-15 NOTE — ED Notes (Signed)
Per PTAR:  Patient woke up this morning with sudden onset of back pain upper lumbar region that radiates out both sides, has history of degenerative disc disease between L4 and L5, upon ems arrival patient was tearful and yelling.  Hx of high cholesterol, EMS VS: 160/100, HR 64, 100% on RA.  Pain:10/10  Patient states that the pain is a 10/10 describes pain as a hard pain worse with movement.  Had no pain last night. And pain that he usually has is lower on his back than what he is experiencing now. He also states pain is way more severe than what he is used to.

## 2014-04-23 ENCOUNTER — Ambulatory Visit: Payer: BC Managed Care – PPO | Admitting: Internal Medicine

## 2014-04-24 ENCOUNTER — Other Ambulatory Visit: Payer: Self-pay | Admitting: Family Medicine

## 2014-04-24 DIAGNOSIS — R911 Solitary pulmonary nodule: Secondary | ICD-10-CM

## 2014-04-30 ENCOUNTER — Ambulatory Visit
Admission: RE | Admit: 2014-04-30 | Discharge: 2014-04-30 | Disposition: A | Discharge status: Home / Self Care | Payer: BC Managed Care – PPO | Source: Ambulatory Visit | Attending: Family Medicine | Admitting: Family Medicine

## 2014-04-30 ENCOUNTER — Encounter (INDEPENDENT_AMBULATORY_CARE_PROVIDER_SITE_OTHER): Payer: Self-pay

## 2014-04-30 DIAGNOSIS — R911 Solitary pulmonary nodule: Secondary | ICD-10-CM

## 2014-05-17 ENCOUNTER — Other Ambulatory Visit: Payer: Self-pay | Admitting: Family Medicine

## 2014-05-17 ENCOUNTER — Ambulatory Visit
Admission: RE | Admit: 2014-05-17 | Discharge: 2014-05-17 | Disposition: A | Discharge status: Home / Self Care | Payer: BC Managed Care – PPO | Source: Ambulatory Visit | Attending: Family Medicine | Admitting: Family Medicine

## 2014-05-17 DIAGNOSIS — M25521 Pain in right elbow: Secondary | ICD-10-CM

## 2014-05-21 ENCOUNTER — Institutional Professional Consult (permissible substitution): Payer: BC Managed Care – PPO | Admitting: Internal Medicine

## 2014-05-25 ENCOUNTER — Ambulatory Visit (INDEPENDENT_AMBULATORY_CARE_PROVIDER_SITE_OTHER): Payer: BC Managed Care – PPO | Admitting: Emergency Medicine

## 2014-05-25 ENCOUNTER — Encounter: Payer: Self-pay | Admitting: Emergency Medicine

## 2014-05-25 VITALS — BP 118/70 | HR 61 | Ht 65.0 in | Wt 220.0 lb

## 2014-05-25 DIAGNOSIS — R059 Cough, unspecified: Secondary | ICD-10-CM

## 2014-05-25 DIAGNOSIS — R05 Cough: Secondary | ICD-10-CM

## 2014-05-25 DIAGNOSIS — R918 Other nonspecific abnormal finding of lung field: Secondary | ICD-10-CM

## 2014-05-25 NOTE — Progress Notes (Signed)
Subjective:    Patient ID: Jerry Larson, male    DOB: [birth year], 58 y.o.   MRN: [id number]  HPI 58 yo never smoker, hx of HH + GERD, hyperlipidemia, diverticular disease, DJD spine, allergies, gout. He underwent PSG in 2014 that showed mild OSA that was not treated. He was noted to have a R basilar 7mm pulm nodule on CT abd in 11/'14. He then had a repeat chest scan 03/15/14 that showed the stable 7mm R basilar nodule, a 5mm R and 7mm LUL nodule.   He tells me that he has cough, occasional SOB that can happen at rest. The cough is non-productive. He is on pantoprazole bid, seems to control his GERD. He just started nasonex recently.   Has worked in Set designer, Designer, fashion/clothing, Musician. Also Radio broadcast assistant.  No military  Has lived in [place] and [place] No known TB exposure.  Negative PPD last year.   Review of Systems  Constitutional: Positive for unexpected weight change. Negative for fever.  HENT: Negative for congestion, dental problem, ear pain, nosebleeds, postnasal drip, rhinorrhea, sinus pressure, sneezing, sore throat and trouble swallowing.   Eyes: Negative for redness and itching.  Respiratory: Positive for cough and shortness of breath. Negative for chest tightness and wheezing.   Cardiovascular: Positive for leg swelling. Negative for palpitations.       Hand and feet  Gastrointestinal: Positive for abdominal distention. Negative for nausea and vomiting.  Genitourinary: Negative for dysuria.  Musculoskeletal: Positive for joint swelling.       Stiffness and pain  Skin: Negative for rash.  Neurological: Negative for headaches.  Hematological: Does not bruise/bleed easily.  Psychiatric/Behavioral: Negative for dysphoric mood. The patient is not nervous/anxious.     Past Medical History  Diagnosis Date  . Hyperlipidemia   . Acid reflux   . Hiatal hernia   . Gout   . DDD (degenerative disc disease)   . Obesity   . Osteopenia   . Diverticulitis large intestine 09/2013    CT  proven-sigmoid  . Anxiety      Family History  Problem Relation Age of Onset  . Pancreatic cancer Father   . Hypertension Mother   . Hypertension Sister   . Colon cancer Neg Hx   . Stomach cancer Neg Hx      History   Social History  . Marital Status: Divorced    Spouse Name: N/A    Number of Children: 2  . Years of Education: N/A   Occupational History  .     Social History Main Topics  . Smoking status: Never Smoker   . Smokeless tobacco: Never Used  . Alcohol Use: No  . Drug Use: No  . Sexual Activity: Not on file   Other Topics Concern  . Not on file   Social History Narrative   CNA     Allergies  Allergen Reactions  . Aspirin     Irritates stomach  . Lipitor [Atorvastatin Calcium] Other (See Comments)    Caused joint aches  . Zocor [Simvastatin] Other (See Comments)    Interfered with blood count     Outpatient Prescriptions Prior to Visit  Medication Sig Dispense Refill  . pantoprazole (PROTONIX) 40 MG tablet Take 1 tablet (40 mg total) by mouth 2 (two) times daily.  60 tablet  4  . AMBULATORY NON FORMULARY MEDICATION Medication Name: Nitroglycerine ointment 0.125 % .  Apply a pea size amount in rectum 4 times a day  30 g  1  . diazepam (VALIUM) 5 MG tablet Take 1 tablet (5 mg total) by mouth 2 (two) times daily.  10 tablet  0  . oxyCODONE-acetaminophen (PERCOCET/ROXICET) 5-325 MG per tablet Take 1-2 tablets by mouth every 4 (four) hours as needed for severe pain.  15 tablet  0   No facility-administered medications prior to visit.         Objective:   Physical Exam Filed Vitals:   05/25/14 1507  BP: 118/70  Pulse: 61  Height: 5\' 5"  (1.651 m)  Weight: 220 lb (99.791 kg)  SpO2: 98%   Gen: Pleasant, obese man, in no distress,  normal affect  ENT: No lesions,  mouth clear, some posterior-pharyngeal erythema, no postnasal drip  Neck: No JVD, no TMG, no carotid bruits  Lungs: No use of accessory muscles, clear without rales or  rhonchi  Cardiovascular: RRR, heart sounds normal, no murmur or gallops, no peripheral edema  Musculoskeletal: No deformities, no cyanosis or clubbing  Neuro: alert, non focal  Skin: Warm, no lesions or rashes      Assessment & Plan:  Pulmonary nodules - repeat Ct scan chest in 10/15 then follow to review   Cough Suspect GERD is biggest contributor. He was also just started on nasal steroid to treat PND. Will stop his bronchodilator (just given) and follow. If continues then will check PFT.

## 2014-05-25 NOTE — Patient Instructions (Signed)
We will plan to repeat your CT scan of the chest in October 2015.  Please follow with Dr Delton CoombesByrum in October after your scan to discuss the results.

## 2014-05-25 NOTE — Assessment & Plan Note (Signed)
Suspect GERD is biggest contributor. He was also just started on nasal steroid to treat PND. Will stop his bronchodilator (just given) and follow. If continues then will check PFT.

## 2014-05-25 NOTE — Assessment & Plan Note (Signed)
-   repeat Ct scan chest in 10/15 then follow to review

## 2014-06-01 ENCOUNTER — Encounter: Payer: Self-pay | Admitting: Gastroenterology

## 2014-06-20 ENCOUNTER — Ambulatory Visit (INDEPENDENT_AMBULATORY_CARE_PROVIDER_SITE_OTHER): Payer: BC Managed Care – PPO | Admitting: Internal Medicine

## 2014-06-20 ENCOUNTER — Encounter: Payer: Self-pay | Admitting: Internal Medicine

## 2014-06-20 VITALS — BP 118/60 | HR 60 | Ht 65.0 in | Wt 219.0 lb

## 2014-06-20 DIAGNOSIS — K5730 Diverticulosis of large intestine without perforation or abscess without bleeding: Secondary | ICD-10-CM

## 2014-06-20 DIAGNOSIS — K648 Other hemorrhoids: Secondary | ICD-10-CM

## 2014-06-20 NOTE — Assessment & Plan Note (Signed)
High fiber diet Info Ok to eat seeds, nuts, popcorn

## 2014-06-20 NOTE — Assessment & Plan Note (Signed)
Doing well RTC prn 

## 2014-06-20 NOTE — Patient Instructions (Signed)
Today you have been given a handout on diverticulosis to read.  Follow up with us as needed.   I appreciate the opportunity to care for you.

## 2014-06-22 NOTE — Progress Notes (Signed)
   Subjective:    Patient ID: Jerry Larson, male    DOB: Jan 21, 1956, 58 y.o.   MRN: [id number]  HPI Doing well 2 months after banding of hemorrhoids other than a couple of episodes of rectal discomfort. Medications, allergies, past medical history, past surgical history, family history and social history are reviewed and updated in the EMR.  Review of Systems As above    Objective:   Physical Exam NAD Abd soft and nontender    Assessment & Plan:  Hemorrhoids, internal, with bleeding Doing well RTC prn   Diverticulosis of colon without hemorrhage High fiber diet Info Ok to eat seeds, nuts, popcorn

## 2014-06-28 ENCOUNTER — Encounter (HOSPITAL_COMMUNITY): Payer: Self-pay | Admitting: Emergency Medicine

## 2014-06-28 ENCOUNTER — Emergency Department (HOSPITAL_COMMUNITY): Payer: BC Managed Care – PPO

## 2014-06-28 ENCOUNTER — Emergency Department (HOSPITAL_COMMUNITY)
Admission: EM | Admit: 2014-06-28 | Discharge: 2014-06-28 | Disposition: A | Discharge status: Home / Self Care | Payer: BC Managed Care – PPO | Attending: Emergency Medicine | Admitting: Emergency Medicine

## 2014-06-28 DIAGNOSIS — K449 Diaphragmatic hernia without obstruction or gangrene: Secondary | ICD-10-CM | POA: Diagnosis not present

## 2014-06-28 DIAGNOSIS — Z791 Long term (current) use of non-steroidal anti-inflammatories (NSAID): Secondary | ICD-10-CM | POA: Insufficient documentation

## 2014-06-28 DIAGNOSIS — Z8659 Personal history of other mental and behavioral disorders: Secondary | ICD-10-CM | POA: Diagnosis not present

## 2014-06-28 DIAGNOSIS — Z8739 Personal history of other diseases of the musculoskeletal system and connective tissue: Secondary | ICD-10-CM | POA: Diagnosis not present

## 2014-06-28 DIAGNOSIS — K219 Gastro-esophageal reflux disease without esophagitis: Secondary | ICD-10-CM | POA: Diagnosis not present

## 2014-06-28 DIAGNOSIS — R066 Hiccough: Secondary | ICD-10-CM | POA: Diagnosis present

## 2014-06-28 DIAGNOSIS — E669 Obesity, unspecified: Secondary | ICD-10-CM | POA: Diagnosis not present

## 2014-06-28 DIAGNOSIS — Z79899 Other long term (current) drug therapy: Secondary | ICD-10-CM | POA: Insufficient documentation

## 2014-06-28 MED ORDER — GI COCKTAIL ~~LOC~~
30.0000 mL | Freq: Once | ORAL | Status: AC
  Administered 2014-06-28: 30 mL via ORAL
  Filled 2014-06-28: qty 30

## 2014-06-28 MED ORDER — CHLORPROMAZINE HCL 25 MG PO TABS: 25.0000 mg | ORAL_TABLET | Freq: Three times a day (TID) | ORAL | Status: DC | PRN

## 2014-06-28 NOTE — Discharge Instructions (Signed)
Hiccups A hiccup is the result of a sudden shortening of the muscle below your lungs (diaphragm). This movement of your diaphragm is then followed by the closing of your vocal cords, which causes the hiccup sound. Most people get the hiccups. Typically, hiccups last only a short amount of time. There are three types of hiccups:   Benign: last less than 48 hours.  Persistent: last more than 48 hours, but less than 1 month.  Intractable: last more than 1 month. A hiccup is a reflex. You cannot control reflexes. CAUSES  Causes of the hiccups can include:   Eating too much.  Drinking too much alcohol or fizzy drinks.  Eating too fast.  Eating or drinking hot and spicy foods or drinks.  Using certain medicines that have hiccupping as a side effect. Several medical conditions may also cause hiccups, including, but not limited to:  Stroke.  Gastroesophageal reflux.  Multiple sclerosis.  Traumatic brain injury.  Brain tumor.  Meningitis.  Having damage to the nerve that affects the diaphragm. Usually, though, hiccups have no apparent cause and are not the result of a serious medical condition. DIAGNOSIS  Tests may be performed to diagnose a possible condition associated with persistent or intractable hiccups. TREATMENT  Most cases of the hiccups need no treatment. None of the numerous home remedies have been proven to be effective. If your hiccups do require treatment, your treatment may include:  Medicine. Medicine may be given intravenously (by IV) or by mouth.  Hypnosis or acupuncture.  Surgery to the nerve that affects the diaphragm may be tried in severe cases. If your hiccups are caused by an underlying medical condition, treatment for the medical condition may be necessary.  HOME CARE INSTRUCTIONS   Eat small meals.  Limit alcohol intake to no more than 1 drink per day for nonpregnant women and 2 drinks per day for men. One drink equals 12 ounces of beer, 5 ounces  of wine, or 1 ounces of hard liquor.  Limit drinking fizzy drinks.  Eat and chew your food slowly.  Take medicines only as directed by your health care provider. SEEK MEDICAL CARE IF:   Your hiccups last for more than 48 hours.  You are given medicine, but your hiccups do not get better.  You cannot sleep or eat due to the hiccups.  You have unexpected weight loss due to the hiccups.  You have trouble breathing or swallowing.  You have a fever.  You develop severe pain in your abdomen.  You develop numbness, tingling, or weakness. Document Released: 01/11/2002 Document Revised: 03/19/2014 Document Reviewed: 12/24/2010 [facility] Patient Information 2015 [place]. This information is not intended to replace advice given to you by your health care provider. Make sure you discuss any questions you have with your health care provider.  Hiatal Hernia A hiatal hernia occurs when part of your stomach slides above the muscle that separates your abdomen from your chest (diaphragm). You can be born with a hiatal hernia (congenital), or it may develop over time. In almost all cases of hiatal hernia, only the top part of the stomach pushes through.  Many people have a hiatal hernia with no symptoms. The larger the hernia, the more likely that you will have symptoms. In some cases, a hiatal hernia allows stomach acid to flow back into the tube that carries food from your mouth to your stomach (esophagus). This may cause heartburn symptoms. Severe heartburn symptoms may mean you have developed a condition called  gastroesophageal reflux disease (GERD).  CAUSES  Hiatal hernias are caused by a weakness in the opening (hiatus) where your esophagus passes through your diaphragm to attach to the upper part of your stomach. You may be born with a weakness in your hiatus, or a weakness can develop. RISK FACTORS Older age is a major risk factor for a hiatal hernia. Anything that increases pressure on  your diaphragm can also increase your risk of a hiatal hernia. This includes:  Pregnancy.  Excess weight.  Frequent constipation. SIGNS AND SYMPTOMS  People with a hiatal hernia often have no symptoms. If symptoms develop, they are almost always caused by GERD. They may include:  Heartburn.  Belching.  Indigestion.  Trouble swallowing.  Coughing or wheezing.  Sore throat.  Hoarseness.  Chest pain. DIAGNOSIS  A hiatal hernia is sometimes found during an exam for another problem. Your health care provider may suspect a hiatal hernia if you have symptoms of GERD. Tests may be done to diagnose GERD. These may include:  X-rays of your stomach or chest.  An upper gastrointestinal (GI) series. This is an X-ray exam of your GI tract involving the use of a chalky liquid that you swallow. The liquid shows up clearly on the X-ray.  Endoscopy. This is a procedure to look into your stomach using a thin, flexible tube that has a tiny camera and light on the end of it. TREATMENT  If you have no symptoms, you may not need treatment. If you have symptoms, treatment may include:  Dietary and lifestyle changes to help reduce GERD symptoms.  Medicines. These may include:  Over-the-counter antacids.  Medicines that make your stomach empty more quickly.  Medicines that block the production of stomach acid (H2 blockers).  Stronger medicines to reduce stomach acid (proton pump inhibitors).  You may need surgery to repair the hernia if other treatments are not helping. HOME CARE INSTRUCTIONS   Take all medicines as directed by your health care provider.  Quit smoking, if you smoke.  Try to achieve and maintain a healthy body weight.  Eat frequent small meals instead of three large meals a day. This keeps your stomach from getting too full.  Eat slowly.  Do not lie down right after eating.  Do noteat 1-2 hours before bed.   Do not drink beverages with caffeine. These  include cola, coffee, cocoa, and tea.  Do not drink alcohol.  Avoid foods that can make symptoms of GERD worse. These may include:  Fatty foods.  Citrus fruits.  Other foods and drinks that contain acid.  Avoid putting pressure on your belly. Anything that puts pressure on your belly increases the amount of acid that may be pushed up into your esophagus.   Avoid bending over, especially after eating.  Raise the head of your bed by putting blocks under the legs. This keeps your head and esophagus higher than your stomach.  Do not wear tight clothing around your chest or stomach.  Try not to strain when having a bowel movement, when urinating, or when lifting heavy objects. SEEK MEDICAL CARE IF:  Your symptoms are not controlled with medicines or lifestyle changes.  You are having trouble swallowing.  You have coughing or wheezing that will not go away. SEEK IMMEDIATE MEDICAL CARE IF:  Your pain is getting worse.  Your pain spreads to your arms, neck, jaw, teeth, or back.  You have shortness of breath.  You sweat for no reason.  You feel sick to  your stomach (nauseous) or vomit.  You vomit blood.  You have bright red blood in your stools.  You have black, tarry stools.  Document Released: 01/23/2004 Document Revised: 03/19/2014 Document Reviewed: 10/20/2013 [facility] Patient Information 2015 [place], [place]. This information is not intended to replace advice given to you by your health care provider. Make sure you discuss any questions you have with your health care provider.

## 2014-06-28 NOTE — ED Notes (Signed)
EDP at the bedside now.

## 2014-06-28 NOTE — ED Notes (Signed)
The pt has had hiccups since 12n today and his throat has been burning

## 2014-06-29 NOTE — ED Provider Notes (Addendum)
CSN: [id number]     Arrival date & time 06/28/14  0304 History   First MD Initiated Contact with Patient 06/28/14 [phone]     Chief Complaint  Patient presents with  . Hiccups     (Consider location/radiation/quality/duration/timing/severity/associated sxs/prior Treatment) HPI Comments: Patient is a 58 year old male who presents with complaints of hiccups and burning in his stomach. This is been going on since approximately noon. He has a history of hiatal hernia and this feels similar. He denies any abdominal pain, fevers, chills. Denies any urinary complaints. He denies any difficulty breathing. There are no aggravating or alleviating factors.  The history is provided by the patient.    Past Medical History  Diagnosis Date  . Hyperlipidemia   . Acid reflux   . Hiatal hernia   . Gout   . DDD (degenerative disc disease)   . Obesity   . Osteopenia   . Diverticulitis large intestine 09/2013    CT proven-sigmoid  . Anxiety   . Heart palpitations   . Hemorrhoids   . Pulmonary nodule   . Diverticulitis of sigmoid colon 10/15/2013   Past Surgical History  Procedure Laterality Date  . Colonoscopy    . Esophagogastroduodenoscopy  12/25/13  . Hemorrhoid banding     Family History  Problem Relation Age of Onset  . Pancreatic cancer Father   . Hypertension Mother   . Hypertension Sister   . Colon cancer Neg Hx   . Stomach cancer Neg Hx    History  Substance Use Topics  . Smoking status: Never Smoker   . Smokeless tobacco: Never Used  . Alcohol Use: No    Review of Systems  All other systems reviewed and are negative.     Allergies  Aspirin; Lipitor; and Zocor  Home Medications   Prior to Admission medications   Medication Sig Start Date End Date Taking? Authorizing Provider  celecoxib (CELEBREX) 200 MG capsule Take 200 mg by mouth daily.   Yes Historical Provider, MD  furosemide (LASIX) 20 MG tablet Take 20 mg by mouth daily.   Yes Historical Provider, MD   mometasone (NASONEX) 50 MCG/ACT nasal spray Place 2 sprays into the nose daily.   Yes Historical Provider, MD  pantoprazole (PROTONIX) 40 MG tablet Take 1 tablet (40 mg total) by mouth 2 (two) times daily. 01/08/14  Yes Mardella Layman, MD  chlorproMAZINE (THORAZINE) 25 MG tablet Take 1 tablet (25 mg total) by mouth 3 (three) times daily as needed. 06/28/14   Geoffery Lyons, MD   BP 115/68  Pulse 55  Temp(Src) 98.3 F (36.8 C) (Oral)  Resp 22  Ht 5\' 5"  (1.651 m)  Wt 219 lb (99.338 kg)  BMI 36.44 kg/m2  SpO2 95% Physical Exam  Nursing note and vitals reviewed. Constitutional: He is oriented to person, place, and time. He appears well-developed and well-nourished. No distress.  HENT:  Head: Normocephalic and atraumatic.  Mouth/Throat: Oropharynx is clear and moist.  Neck: Normal range of motion. Neck supple.  Cardiovascular: Normal rate, regular rhythm and normal heart sounds.   No murmur heard. Pulmonary/Chest: Effort normal and breath sounds normal. No respiratory distress. He has no wheezes.  Abdominal: Soft. Bowel sounds are normal. He exhibits no distension. There is no tenderness.  Musculoskeletal: Normal range of motion. He exhibits no edema.  Neurological: He is alert and oriented to person, place, and time.  Skin: Skin is warm and dry. He is not diaphoretic.    ED Course  Procedures (including  critical care time) Labs Review Labs Reviewed - No data to display  Imaging Review Dg Chest 2 View  06/28/2014   CLINICAL DATA:  Hiccups and gastroesophageal reflux.  EXAM: CHEST  2 VIEW  COMPARISON:  CTA of the chest performed 06/14/2014, and chest radiograph from 02/25/2014  FINDINGS: The lungs are well-aerated. Mild vascular congestion is noted. There is no evidence of focal opacification, pleural effusion or pneumothorax.  The heart is borderline normal in size; the mediastinal contour is within normal limits. No acute osseous abnormalities are seen.  IMPRESSION: Mild vascular  congestion noted; lungs remain grossly clear.   Electronically Signed   By: Roanna RaiderJeffery  Chang M.D.   On: 06/28/2014 04:13     EKG Interpretation None      MDM   Final diagnoses:  Hiatal hernia  Hiccups    Patient presents with complaints of hiccups and burning in his abdomen which he feels is related to his hiatal hernia. He was given a GI cocktail with relief, then his symptoms returned shortly thereafter. Chest x-ray reveals no acute abnormality. He will be treated with antacids and Thorazine for his hiccups.    Geoffery Lyonsouglas Jenya Putz, MD 06/29/14 [id number]  Geoffery Lyonsouglas Deandra Goering, MD 07/10/14 [phone]

## 2014-07-14 ENCOUNTER — Emergency Department (HOSPITAL_COMMUNITY)
Admission: EM | Admit: 2014-07-14 | Discharge: 2014-07-15 | Disposition: A | Discharge status: Home / Self Care | Payer: BC Managed Care – PPO | Attending: Emergency Medicine | Admitting: Emergency Medicine

## 2014-07-14 ENCOUNTER — Encounter (HOSPITAL_COMMUNITY): Payer: Self-pay | Admitting: Emergency Medicine

## 2014-07-14 DIAGNOSIS — E669 Obesity, unspecified: Secondary | ICD-10-CM | POA: Insufficient documentation

## 2014-07-14 DIAGNOSIS — IMO0002 Reserved for concepts with insufficient information to code with codable children: Secondary | ICD-10-CM | POA: Insufficient documentation

## 2014-07-14 DIAGNOSIS — Z862 Personal history of diseases of the blood and blood-forming organs and certain disorders involving the immune mechanism: Secondary | ICD-10-CM | POA: Insufficient documentation

## 2014-07-14 DIAGNOSIS — Z8639 Personal history of other endocrine, nutritional and metabolic disease: Secondary | ICD-10-CM | POA: Insufficient documentation

## 2014-07-14 DIAGNOSIS — Z79899 Other long term (current) drug therapy: Secondary | ICD-10-CM | POA: Diagnosis not present

## 2014-07-14 DIAGNOSIS — Z8659 Personal history of other mental and behavioral disorders: Secondary | ICD-10-CM | POA: Insufficient documentation

## 2014-07-14 DIAGNOSIS — Z8679 Personal history of other diseases of the circulatory system: Secondary | ICD-10-CM | POA: Diagnosis not present

## 2014-07-14 DIAGNOSIS — M25549 Pain in joints of unspecified hand: Secondary | ICD-10-CM | POA: Diagnosis not present

## 2014-07-14 DIAGNOSIS — Z791 Long term (current) use of non-steroidal anti-inflammatories (NSAID): Secondary | ICD-10-CM | POA: Insufficient documentation

## 2014-07-14 DIAGNOSIS — M109 Gout, unspecified: Secondary | ICD-10-CM | POA: Diagnosis present

## 2014-07-14 DIAGNOSIS — K219 Gastro-esophageal reflux disease without esophagitis: Secondary | ICD-10-CM | POA: Insufficient documentation

## 2014-07-14 DIAGNOSIS — M79601 Pain in right arm: Secondary | ICD-10-CM

## 2014-07-14 NOTE — ED Notes (Signed)
Pt. reports gout flare up at right wrist/finger joints with pain and swelling since Saturday this week unrelieved by prescription medication . Denies fever .

## 2014-07-15 MED ORDER — HYDROMORPHONE HCL PF 1 MG/ML IJ SOLN
1.0000 mg | Freq: Once | INTRAMUSCULAR | Status: AC
  Administered 2014-07-15: 1 mg via INTRAMUSCULAR
  Filled 2014-07-15: qty 1

## 2014-07-15 MED ORDER — HYDROCODONE-ACETAMINOPHEN 5-325 MG PO TABS: 1.0000 | ORAL_TABLET | Freq: Four times a day (QID) | ORAL | Status: DC | PRN

## 2014-07-15 MED ORDER — KETOROLAC TROMETHAMINE 30 MG/ML IJ SOLN
30.0000 mg | Freq: Once | INTRAMUSCULAR | Status: AC
  Administered 2014-07-15: 30 mg via INTRAMUSCULAR
  Filled 2014-07-15: qty 1

## 2014-07-15 NOTE — ED Notes (Signed)
Gave wallet to security to be locked up. Pt notified via voicemail of its location.

## 2014-07-15 NOTE — ED Provider Notes (Signed)
CSN: [id number]     Arrival date & time 07/14/14  2344 History   First MD Initiated Contact with Patient 07/15/14 0001     Chief Complaint  Patient presents with  . Gout     HPI  Patient presents with increasing pain in his right wrist and hand. Pain began approximately one week ago, initially with pain in the elbow.  Subsequently, the elbow pain has resolved, but the pain has moved distally, and currently the patient has severe, sharp pain throughout the hand and wrist, worse with motion. Minimally improved with home medication and allopurinol. There is no associated loss of sensation, nor weakness. There is no fever, chills, skin color changes, or other ongoing concerns.   Past Medical History  Diagnosis Date  . Hyperlipidemia   . Acid reflux   . Hiatal hernia   . Gout   . DDD (degenerative disc disease)   . Obesity   . Osteopenia   . Diverticulitis large intestine 09/2013    CT proven-sigmoid  . Anxiety   . Heart palpitations   . Hemorrhoids   . Pulmonary nodule   . Diverticulitis of sigmoid colon 10/15/2013   Past Surgical History  Procedure Laterality Date  . Colonoscopy    . Esophagogastroduodenoscopy  12/25/13  . Hemorrhoid banding     Family History  Problem Relation Age of Onset  . Pancreatic cancer Father   . Hypertension Mother   . Hypertension Sister   . Colon cancer Neg Hx   . Stomach cancer Neg Hx    History  Substance Use Topics  . Smoking status: Never Smoker   . Smokeless tobacco: Never Used  . Alcohol Use: No    Review of Systems  Constitutional:       Per HPI, otherwise negative  HENT:       Per HPI, otherwise negative  Respiratory:       Per HPI, otherwise negative  Cardiovascular:       Per HPI, otherwise negative  Gastrointestinal: Negative for nausea.  Genitourinary:       No polyuria, no dysuria  Musculoskeletal: Positive for arthralgias and joint swelling.  Skin: Negative for color change.  Allergic/Immunologic: Negative for  immunocompromised state.  Neurological: Negative for weakness.      Allergies  Aspirin; Lipitor; and Zocor  Home Medications   Prior to Admission medications   Medication Sig Start Date End Date Taking? Authorizing Provider  celecoxib (CELEBREX) 200 MG capsule Take 200 mg by mouth daily.    Historical Provider, MD  chlorproMAZINE (THORAZINE) 25 MG tablet Take 1 tablet (25 mg total) by mouth 3 (three) times daily as needed. 06/28/14   Geoffery Lyons, MD  furosemide (LASIX) 20 MG tablet Take 20 mg by mouth daily.    Historical Provider, MD  mometasone (NASONEX) 50 MCG/ACT nasal spray Place 2 sprays into the nose daily.    Historical Provider, MD  pantoprazole (PROTONIX) 40 MG tablet Take 1 tablet (40 mg total) by mouth 2 (two) times daily. 01/08/14   Mardella Layman, MD   BP 137/65  Pulse 73  Temp(Src) 97.7 F (36.5 C) (Oral)  Resp 20  SpO2 98% Physical Exam  Nursing note and vitals reviewed. Constitutional: He is oriented to person, place, and time. He appears well-developed. No distress.  HENT:  Head: Normocephalic and atraumatic.  Eyes: Conjunctivae and EOM are normal.  Cardiovascular: Normal rate and regular rhythm.   Pulmonary/Chest: Effort normal. No stridor. No respiratory distress.  Abdominal:  He exhibits no distension.  Musculoskeletal:       Right elbow: Normal.      Right wrist: He exhibits decreased range of motion, tenderness and swelling. He exhibits no crepitus, no deformity and no laceration.  Patient unwilling to move R wrist 2/2 pain. No erythema, no drainage, no warmth of the skin.   Neurological: He is alert and oriented to person, place, and time.  Skin: Skin is warm and dry.  Psychiatric: He has a normal mood and affect.    ED Course  Procedures (including critical care time)   On repeat exam the patient is comfortable-appearing. MDM   Patient presents with new right wrist and elbow pain, which has resolved. The patient has limited range of motion  secondary to pain, there is no redness, warmth, fever or chills suggestive of septic arthritis. Patient has a well-documented history of gout, has had flares in the wrist previously. Patient had pain relief here, was discharged in stable condition to follow up with his primary care physician for additional evaluation and management.     Gerhard Munch, MD 07/15/14 [phone]

## 2014-07-15 NOTE — Discharge Instructions (Signed)
As discussed, it is important that you follow up as soon as possible with your physician for continued management of your condition.  If you develop any new, or concerning changes in your condition, please return to the emergency department immediately.    Cryotherapy Cryotherapy means treatment with cold. Ice or gel packs can be used to reduce both pain and swelling. Ice is the most helpful within the first 24 to 48 hours after an injury or flare-up from overusing a muscle or joint. Sprains, strains, spasms, burning pain, shooting pain, and aches can all be eased with ice. Ice can also be used when recovering from surgery. Ice is effective, has very few side effects, and is safe for most people to use. PRECAUTIONS  Ice is not a safe treatment option for people with:  Raynaud phenomenon. This is a condition affecting small blood vessels in the extremities. Exposure to cold may cause your problems to return.  Cold hypersensitivity. There are many forms of cold hypersensitivity, including:  Cold urticaria. Red, itchy hives appear on the skin when the tissues begin to warm after being iced.  Cold erythema. This is a red, itchy rash caused by exposure to cold.  Cold hemoglobinuria. Red blood cells break down when the tissues begin to warm after being iced. The hemoglobin that carry oxygen are passed into the urine because they cannot combine with blood proteins fast enough.  Numbness or altered sensitivity in the area being iced. If you have any of the following conditions, do not use ice until you have discussed cryotherapy with your caregiver:  Heart conditions, such as arrhythmia, angina, or chronic heart disease.  High blood pressure.  Healing wounds or open skin in the area being iced.  Current infections.  Rheumatoid arthritis.  Poor circulation.  Diabetes. Ice slows the blood flow in the region it is applied. This is beneficial when trying to stop inflamed tissues from  spreading irritating chemicals to surrounding tissues. However, if you expose your skin to cold temperatures for too long or without the proper protection, you can damage your skin or nerves. Watch for signs of skin damage due to cold. HOME CARE INSTRUCTIONS Follow these tips to use ice and cold packs safely.  Place a dry or damp towel between the ice and skin. A damp towel will cool the skin more quickly, so you may need to shorten the time that the ice is used.  For a more rapid response, add gentle compression to the ice.  Ice for no more than 10 to 20 minutes at a time. The bonier the area you are icing, the less time it will take to get the benefits of ice.  Check your skin after 5 minutes to make sure there are no signs of a poor response to cold or skin damage.  Rest 20 minutes or more between uses.  Once your skin is numb, you can end your treatment. You can test numbness by very lightly touching your skin. The touch should be so light that you do not see the skin dimple from the pressure of your fingertip. When using ice, most people will feel these normal sensations in this order: cold, burning, aching, and numbness.  Do not use ice on someone who cannot communicate their responses to pain, such as small children or people with dementia. HOW TO MAKE AN ICE PACK Ice packs are the most common way to use ice therapy. Other methods include ice massage, ice baths, and cryosprays. Muscle creams  that cause a cold, tingly feeling do not offer the same benefits that ice offers and should not be used as a substitute unless recommended by your caregiver. To make an ice pack, do one of the following:  Place crushed ice or a bag of frozen vegetables in a sealable plastic bag. Squeeze out the excess air. Place this bag inside another plastic bag. Slide the bag into a pillowcase or place a damp towel between your skin and the bag.  Mix 3 parts water with 1 part rubbing alcohol. Freeze the mixture  in a sealable plastic bag. When you remove the mixture from the freezer, it will be slushy. Squeeze out the excess air. Place this bag inside another plastic bag. Slide the bag into a pillowcase or place a damp towel between your skin and the bag. SEEK MEDICAL CARE IF:  You develop white spots on your skin. This may give the skin a blotchy (mottled) appearance.  Your skin turns blue or pale.  Your skin becomes waxy or hard.  Your swelling gets worse. MAKE SURE YOU:   Understand these instructions.  Will watch your condition.  Will get help right away if you are not doing well or get worse. Document Released: 06/29/2011 Document Revised: 03/19/2014 Document Reviewed: 06/29/2011 [facility] Patient Information 2015 [place], [place]. This information is not intended to replace advice given to you by your health care provider. Make sure you discuss any questions you have with your health care provider.

## 2014-07-15 NOTE — ED Notes (Signed)
Tried calling patient on cell phone X 3 and left msg about patient leaving wallet.

## 2014-08-29 ENCOUNTER — Ambulatory Visit (INDEPENDENT_AMBULATORY_CARE_PROVIDER_SITE_OTHER)
Admission: RE | Admit: 2014-08-29 | Discharge: 2014-08-29 | Disposition: A | Discharge status: Home / Self Care | Payer: BC Managed Care – PPO | Source: Ambulatory Visit | Attending: Emergency Medicine | Admitting: Emergency Medicine

## 2014-08-29 DIAGNOSIS — R918 Other nonspecific abnormal finding of lung field: Secondary | ICD-10-CM

## 2014-09-06 ENCOUNTER — Telehealth: Payer: Self-pay | Admitting: Emergency Medicine

## 2014-09-06 NOTE — Telephone Encounter (Signed)
Pt is requesting CT results from 08/30/14. Please advise RB thanks

## 2014-09-07 NOTE — Telephone Encounter (Signed)
Patient notified.  Nothing further needed. 

## 2014-09-07 NOTE — Telephone Encounter (Signed)
Please let him know that his CT scan is stable - no change in any of the pulm nodules.

## 2014-10-30 IMAGING — CT CT CHEST W/O CM
2 of 4 series · 15 of 36 positions shown, 18 images · IV contrast (Omnipaque 300)
Comparison: 03/15/2014

CLINICAL DATA: Followup indeterminate pulmonary nodules.

EXAM:
CT CHEST WITHOUT CONTRAST
TECHNIQUE: Multidetector CT imaging of the chest was performed following the
standard protocol without IV contrast..

[Series 2: chest routine with · axial · 0.71mm/px · z∈[-272,-8]mm · 12 of 63 slices shown, 15 images]
[im 5/63  mediastinal]
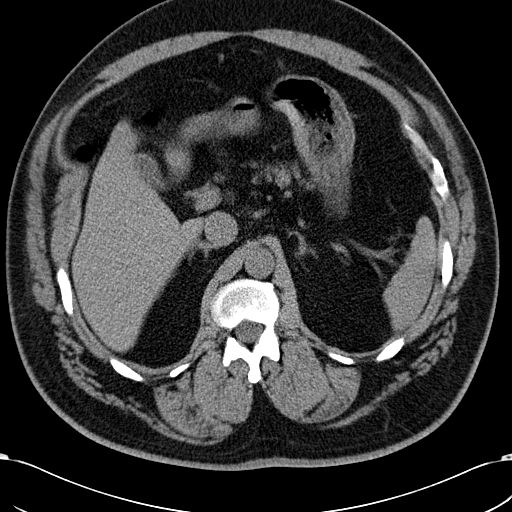
[im 5/63  lung]
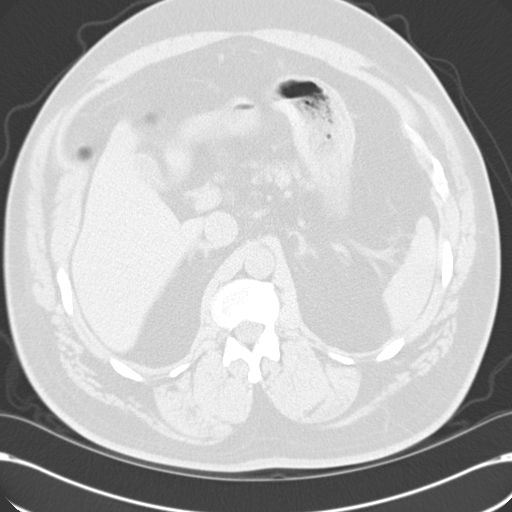
[im 10/63  lung]
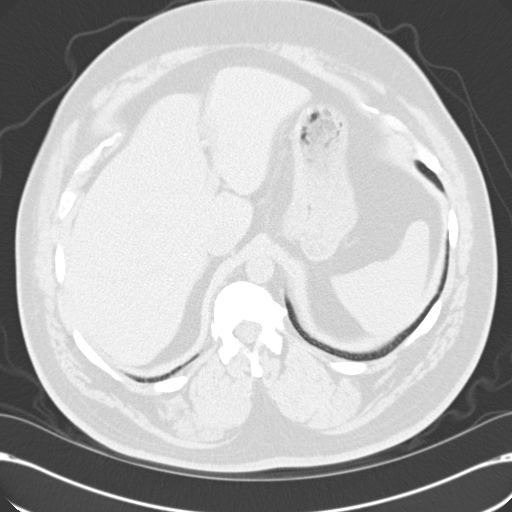
[im 15/63  lung]
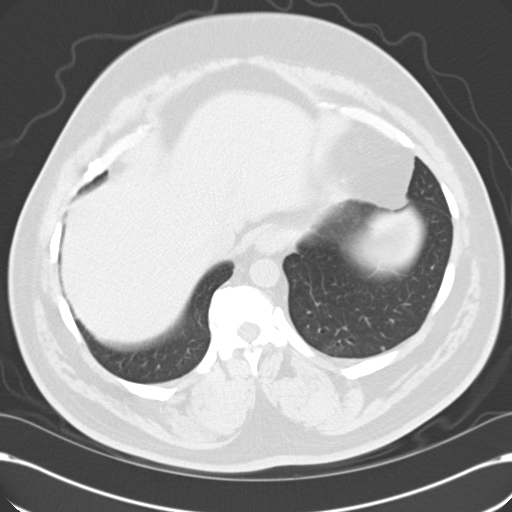
[im 20/63  lung]
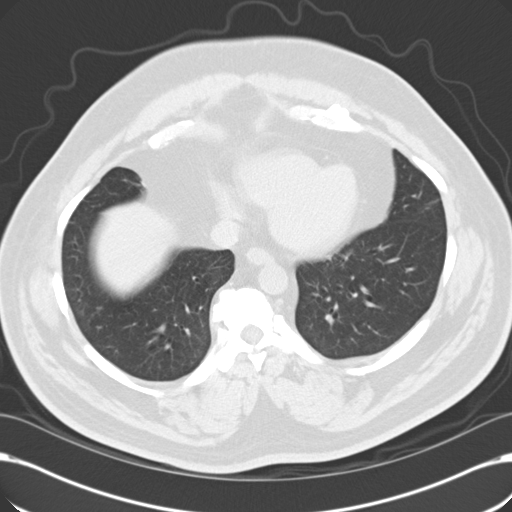
[im 24/63  mediastinal]
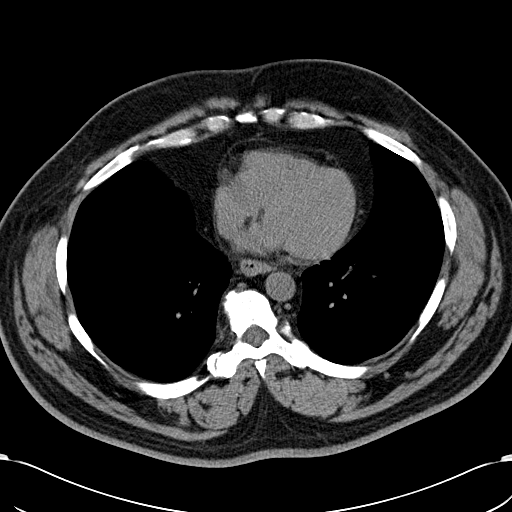
[im 24/63  lung]
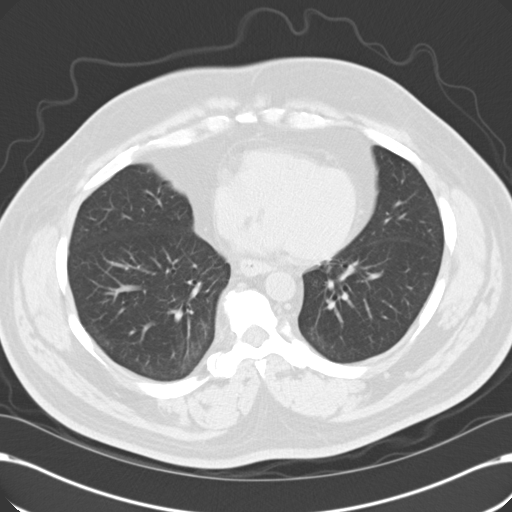
[im 29/63  lung]
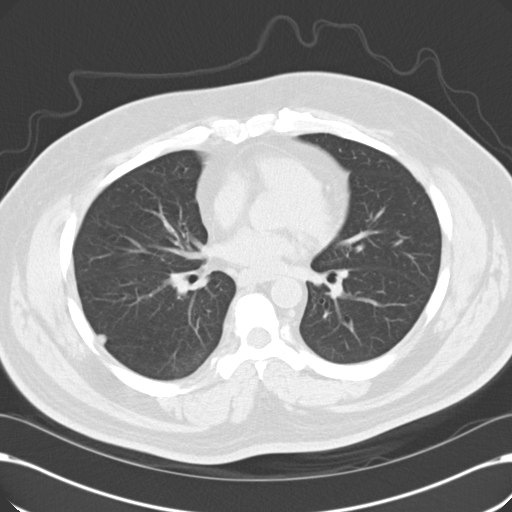
[im 34/63  lung]
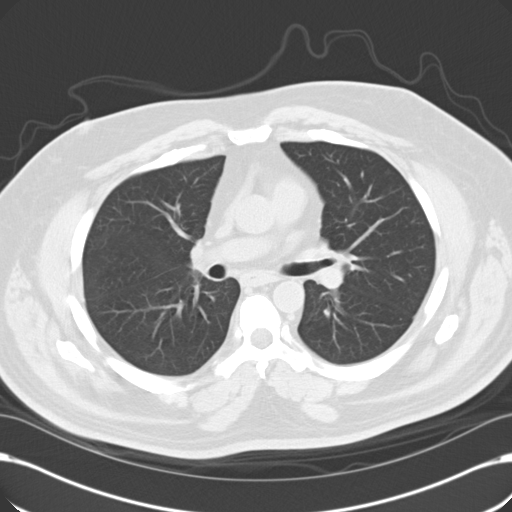
[im 39/63  lung]
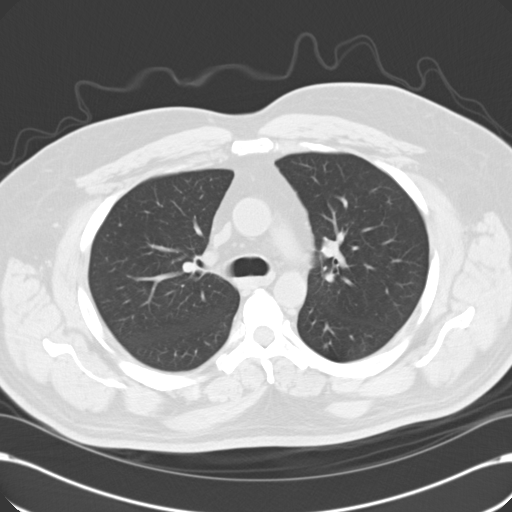
[im 43/63  mediastinal]
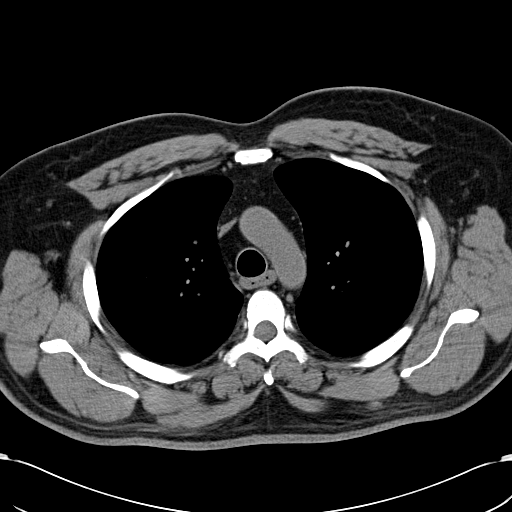
[im 43/63  lung]
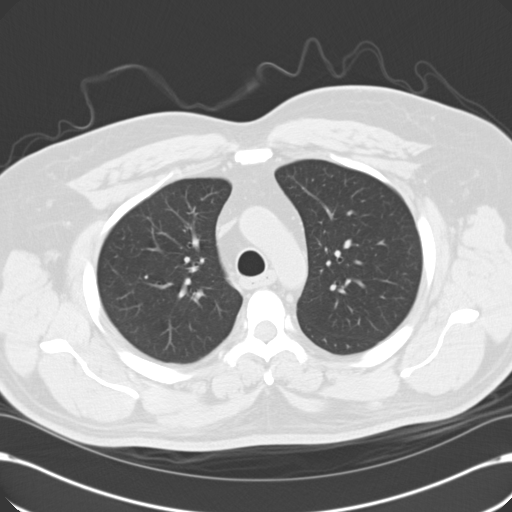
[im 48/63  lung]
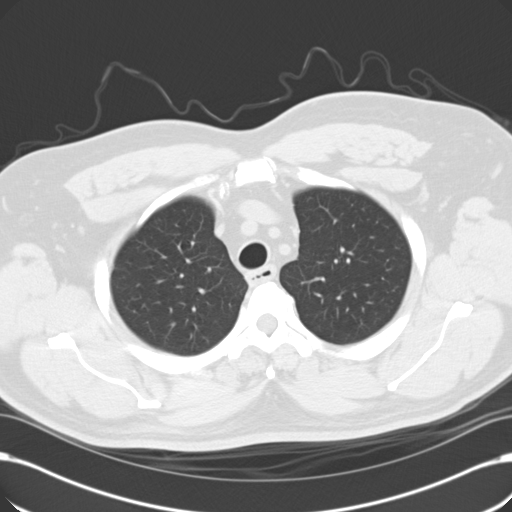
[im 53/63  lung]
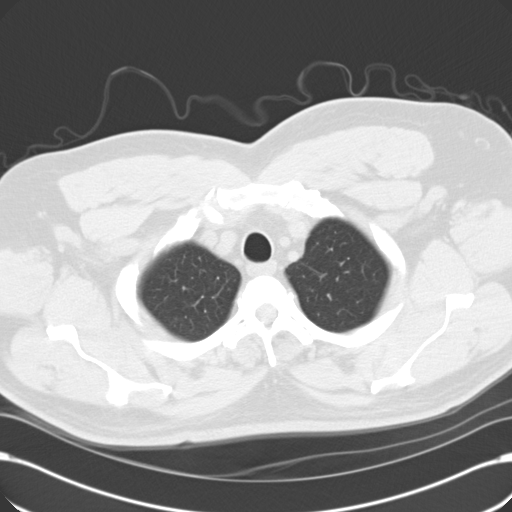
[im 58/63  lung]
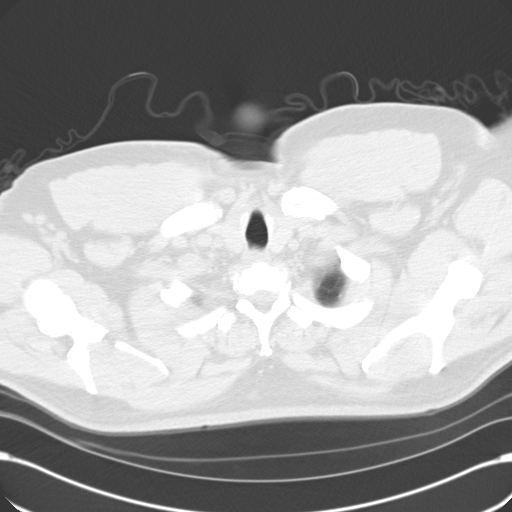

[Series 602: cor · coronal · 0.71mm/px · 3 of 106 slices shown]
[im 22/106  lung]
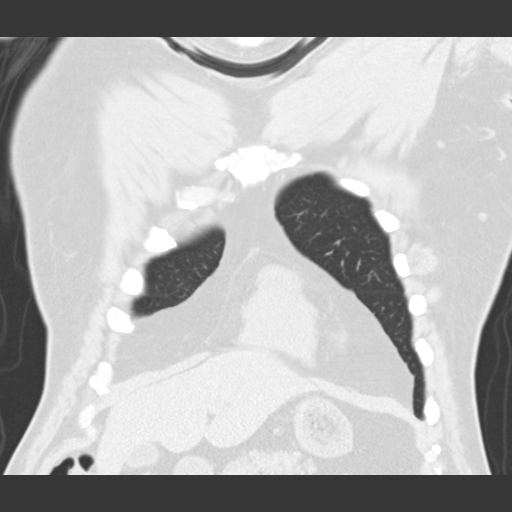
[im 43/106  lung]
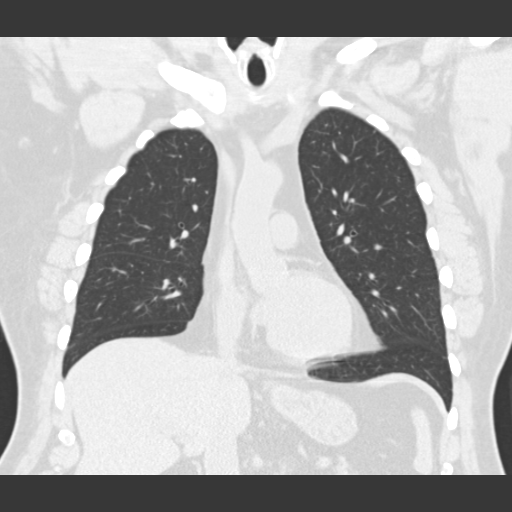
[im 64/106  lung]
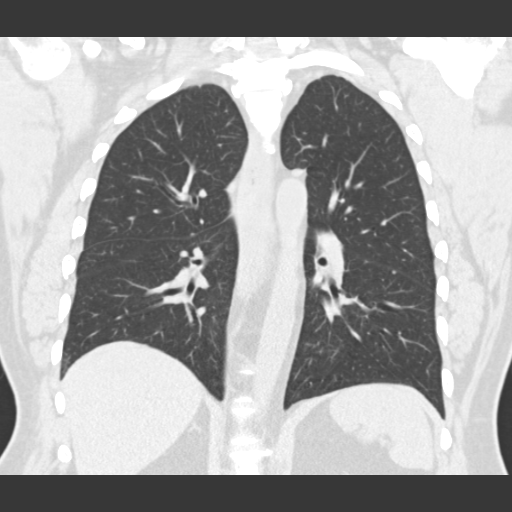

[15 of 36 positions shown; findings below may reference images not displayed]

FINDINGS: Mediastinum/Hilar Regions: No masses or pathologically enlarged
lymph nodes identified.

Other Thoracic Lymphadenopathy:  None.

Lungs: 7 mm pulmonary nodule in the lingula on image 32 is stable. 7
mm nodule in the peripheral right lower lobe on image 35 remains
stable. 5 mm nodule in the right middle lobe on image 33 is also
stable. A few other scattered less than 5 mm pulmonary nodules are
also noted bilaterally.

Pleura:  No evidence of effusion or mass.

Vascular/Cardiac:  No acute findings identified.

Other:  None.

Musculoskeletal:  No suspicious bone lesions identified.
IMPRESSION: No significant change in bilateral pulmonary nodules measuring up to
7 mm. Continued followup by chest CT recommended in 6 months if
there are risk factors for bronchogenic carcinoma, or 12 months in a
low risk patient. This recommendation follows the consensus
statement: Guidelines for Management of Small Pulmonary Nodules
Detected on CT Scans: A Statement from the [HOSPITAL] as

## 2015-04-01 ENCOUNTER — Emergency Department (HOSPITAL_COMMUNITY): Payer: BLUE CROSS/BLUE SHIELD

## 2015-04-01 ENCOUNTER — Emergency Department (HOSPITAL_COMMUNITY)
Admission: EM | Admit: 2015-04-01 | Discharge: 2015-04-01 | Disposition: A | Discharge status: Home / Self Care | Payer: BLUE CROSS/BLUE SHIELD | Attending: Emergency Medicine | Admitting: Emergency Medicine

## 2015-04-01 ENCOUNTER — Encounter (HOSPITAL_COMMUNITY): Payer: Self-pay | Admitting: *Deleted

## 2015-04-01 DIAGNOSIS — Z8719 Personal history of other diseases of the digestive system: Secondary | ICD-10-CM | POA: Diagnosis not present

## 2015-04-01 DIAGNOSIS — Z8639 Personal history of other endocrine, nutritional and metabolic disease: Secondary | ICD-10-CM | POA: Diagnosis not present

## 2015-04-01 DIAGNOSIS — Z791 Long term (current) use of non-steroidal anti-inflammatories (NSAID): Secondary | ICD-10-CM | POA: Diagnosis not present

## 2015-04-01 DIAGNOSIS — Z8659 Personal history of other mental and behavioral disorders: Secondary | ICD-10-CM | POA: Diagnosis not present

## 2015-04-01 DIAGNOSIS — M79662 Pain in left lower leg: Secondary | ICD-10-CM | POA: Diagnosis present

## 2015-04-01 DIAGNOSIS — Z79899 Other long term (current) drug therapy: Secondary | ICD-10-CM | POA: Insufficient documentation

## 2015-04-01 DIAGNOSIS — M7652 Patellar tendinitis, left knee: Secondary | ICD-10-CM | POA: Diagnosis not present

## 2015-04-01 DIAGNOSIS — E669 Obesity, unspecified: Secondary | ICD-10-CM | POA: Diagnosis not present

## 2015-04-01 DIAGNOSIS — R52 Pain, unspecified: Secondary | ICD-10-CM

## 2015-04-01 DIAGNOSIS — M109 Gout, unspecified: Secondary | ICD-10-CM | POA: Diagnosis not present

## 2015-04-01 MED ORDER — PREDNISONE 20 MG PO TABS: 40.0000 mg | ORAL_TABLET | Freq: Every day | ORAL | Status: DC

## 2015-04-01 MED ORDER — PREDNISONE 20 MG PO TABS
60.0000 mg | ORAL_TABLET | Freq: Once | ORAL | Status: AC
  Administered 2015-04-01: 60 mg via ORAL
  Filled 2015-04-01: qty 3

## 2015-04-01 MED ORDER — HYDROCODONE-ACETAMINOPHEN 5-325 MG PO TABS: ORAL_TABLET | ORAL | Status: DC

## 2015-04-01 MED ORDER — ACETAMINOPHEN 325 MG PO TABS
650.0000 mg | ORAL_TABLET | Freq: Once | ORAL | Status: AC
  Administered 2015-04-01: 650 mg via ORAL
  Filled 2015-04-01: qty 2

## 2015-04-01 NOTE — Discharge Instructions (Signed)
Please follow with your primary care doctor in the next 2 days for a check-up. They must obtain records for further management.  ° °Do not hesitate to return to the Emergency Department for any new, worsening or concerning symptoms.  ° °Take vicodin for breakthrough pain, do not drink alcohol, drive, care for children or do other critical tasks while taking vicodin. ° °

## 2015-04-01 NOTE — ED Notes (Signed)
Pt to radiology.

## 2015-04-01 NOTE — ED Notes (Signed)
Pt c/o left leg pain; directly below knee cap; no obvious injury; no bruising; minimal swelling noted; no warmth or redness; pain with ambulation

## 2015-04-01 NOTE — ED Provider Notes (Signed)
CSN: [id number]     Arrival date & time 04/01/15  1326 History  This chart was scribed for a non-physician practitioner, Jerry EmeryNicole Jahred Tatar, PA-C working with Rolland PorterMark James, MD by [place] Larson, ED Scribe. The patient was seen in [facility]. The patient's care was started at 2:03 PM.    Chief Complaint  Patient presents with  . Leg Pain      Patient is a 59 y.o. male presenting with leg pain. The history is provided by the patient. No language interpreter was used.  Leg Pain  HPI Comments: Jerry Larson is a 59 y.o. male who presents to the Emergency Department complaining of severe left leg pain that started yesterday specifically to area directly below the knee with slight swelling and redness noted. Rates pain as 10/10. Pt denies any recent traumas or mechanisms of injury to affected area. Pt is currently using a walker for assistance due to pain which he explains is not normal for him. He denies any similar occurrences of pain in the past.    Past Medical History  Diagnosis Date  . Hyperlipidemia   . Acid reflux   . Hiatal hernia   . Gout   . DDD (degenerative disc disease)   . Obesity   . Osteopenia   . Diverticulitis large intestine 09/2013    CT proven-sigmoid  . Anxiety   . Heart palpitations   . Hemorrhoids   . Pulmonary nodule   . Diverticulitis of sigmoid colon 10/15/2013   Past Surgical History  Procedure Laterality Date  . Colonoscopy    . Esophagogastroduodenoscopy  12/25/13  . Hemorrhoid banding     Family History  Problem Relation Age of Onset  . Pancreatic cancer Father   . Hypertension Mother   . Hypertension Sister   . Colon cancer Neg Hx   . Stomach cancer Neg Hx    History  Substance Use Topics  . Smoking status: Never Smoker   . Smokeless tobacco: Never Used  . Alcohol Use: No    Review of Systems  Musculoskeletal: Positive for arthralgias.       Pain below left knee.    A complete 10 system review of systems was obtained and all systems are  negative except as noted in the HPI and PMH.     Allergies  Aspirin; Lipitor; and Zocor  Home Medications   Prior to Admission medications   Medication Sig Start Date End Date Taking? Authorizing Provider  allopurinol (ZYLOPRIM) 300 MG tablet Take 300 mg by mouth daily.    Historical Provider, MD  celecoxib (CELEBREX) 200 MG capsule Take 200 mg by mouth daily.    Historical Provider, MD  HYDROcodone-acetaminophen (NORCO/VICODIN) 5-325 MG per tablet Take 1-2 tablets by mouth every 6 hours as needed for pain and/or cough. 04/01/15   Jerry Benham, PA-C  predniSONE (DELTASONE) 20 MG tablet Take 2 tablets (40 mg total) by mouth daily. 04/01/15   Jerry Cloke, PA-C   BP 118/55 mmHg  Pulse 55  Temp(Src) 98.2 F (36.8 C)  Resp 16  Wt 214 lb (97.07 kg)  SpO2 99% Physical Exam  Constitutional: He is oriented to person, place, and time. He appears well-developed and well-nourished. No distress.  HENT:  Head: Normocephalic.  Eyes: Conjunctivae and EOM are normal.  Cardiovascular: Normal rate.   Pulmonary/Chest: Effort normal. No stridor.  Musculoskeletal: Normal range of motion. He exhibits tenderness.       Legs: Patient is point tender to palpation at the insertion of the  left patellar tendon. There is no overlying skin changes.   No deformity, erythema or abrasions. FROM. No effusion or crepitance. Anterior and posterior drawer show no abnormal laxity. Stable to valgus and varus stress. Joint lines are non-tender. Neurovascularly intact.    Neurological: He is alert and oriented to person, place, and time.  Psychiatric: He has a normal mood and affect.  Nursing note and vitals reviewed.   ED Course  Procedures (including critical care time) Labs Review Labs Reviewed - No data to display  Imaging Review Dg Knee Complete 4 Views Left  04/01/2015   CLINICAL DATA:  Knee pain immediately inferior to the patella scratch the left knee pain immediately inferior to the patella. No  known injury. Initial encounter.  EXAM: LEFT KNEE - COMPLETE 4+ VIEW  COMPARISON:  None.  FINDINGS: There is no acute bony or joint abnormality. Enthesopathic change of the patellar tendon attachments to the tibia and patella is noted. There is no joint effusion. No evidence of degenerative disease is seen.  IMPRESSION: No acute abnormality.  Enthesopathic change at the attachment site of the patellar tendon.   Electronically Signed   By: Jerry Larson  Dalessio M.D.   On: 04/01/2015 15:55     EKG Interpretation None     Medications  predniSONE (DELTASONE) tablet 60 mg (not administered)  acetaminophen (TYLENOL) tablet 650 mg (650 mg Oral Given 04/01/15 1443)    2:07 PM- Treatment plan was discussed with patient who verbalizes understanding and agrees.   MDM   Final diagnoses:  Pain  Patellar tendinitis of left knee   Filed Vitals:   04/01/15 1343  BP: 118/55  Pulse: 55  Temp: 98.2 F (36.8 C)  Resp: 16  Weight: 214 lb (97.07 kg)  SpO2: 99%    Medications  predniSONE (DELTASONE) tablet 60 mg (not administered)  acetaminophen (TYLENOL) tablet 650 mg (650 mg Oral Given 04/01/15 1443)    Jerry LatchGeorge Larson is a pleasant 59 y.o. male presenting with point tenderness to left patella tendon insertion on the inferior side. There was no trauma, full range of motion to knee. States pain is exacerbated by weightbearing. Patient has crutches at home. No warmth or effusion to the knee, no overlying skin changes. X-ray shows an osteopathic change at the attachment side of the patellar tendon. Patient is encouraged to follow closely with his water. The ped[facility], he cannot remember his name but [facility], I will encourage patient to use his crutches, and will be given Vicodin for pain control and prednisone for anti-inflammatory.   Evaluation does not show pathology that would require ongoing emergent intervention or inpatient treatment. Pt is hemodynamically stable and mentating  appropriately. Discussed findings and plan with patient/guardian, who agrees with care plan. All questions answered. Return precautions discussed and outpatient follow up given.   New Prescriptions   HYDROCODONE-ACETAMINOPHEN (NORCO/VICODIN) 5-325 MG PER TABLET    Take 1-2 tablets by mouth every 6 hours as needed for pain and/or cough.   PREDNISONE (DELTASONE) 20 MG TABLET    Take 2 tablets (40 mg total) by mouth daily.    I personally performed the services described in this documentation, which was scribed in my presence. The recorded information has been reviewed and is accurate.    Jerry Emeryicole Lyrah Bradt, PA-C 04/01/15 1611  Rolland PorterMark James, MD 04/10/15 2231

## 2015-04-02 ENCOUNTER — Telehealth (HOSPITAL_COMMUNITY): Payer: Self-pay

## 2015-04-02 NOTE — Telephone Encounter (Signed)
Pt requested work note to be extended. I spoke with Jerry EmeryNicole Pisciotta she said extend for 1 more day. If problems continue to follow up with ortho. . This was explained to pt.

## 2015-04-03 ENCOUNTER — Emergency Department (HOSPITAL_COMMUNITY)
Admission: EM | Admit: 2015-04-03 | Discharge: 2015-04-03 | Disposition: A | Discharge status: Home / Self Care | Payer: BLUE CROSS/BLUE SHIELD | Attending: Emergency Medicine | Admitting: Emergency Medicine

## 2015-04-03 ENCOUNTER — Encounter (HOSPITAL_COMMUNITY): Payer: Self-pay

## 2015-04-03 ENCOUNTER — Telehealth (HOSPITAL_BASED_OUTPATIENT_CLINIC_OR_DEPARTMENT_OTHER): Payer: Self-pay | Admitting: Emergency Medicine

## 2015-04-03 DIAGNOSIS — Z8659 Personal history of other mental and behavioral disorders: Secondary | ICD-10-CM | POA: Insufficient documentation

## 2015-04-03 DIAGNOSIS — L539 Erythematous condition, unspecified: Secondary | ICD-10-CM | POA: Diagnosis not present

## 2015-04-03 DIAGNOSIS — Z8719 Personal history of other diseases of the digestive system: Secondary | ICD-10-CM | POA: Diagnosis not present

## 2015-04-03 DIAGNOSIS — Z8739 Personal history of other diseases of the musculoskeletal system and connective tissue: Secondary | ICD-10-CM | POA: Diagnosis not present

## 2015-04-03 DIAGNOSIS — M25562 Pain in left knee: Secondary | ICD-10-CM | POA: Diagnosis present

## 2015-04-03 DIAGNOSIS — E669 Obesity, unspecified: Secondary | ICD-10-CM | POA: Insufficient documentation

## 2015-04-03 NOTE — ED Provider Notes (Signed)
CSN: [id number]     Arrival date & time 04/03/15  1114 History   First MD Initiated Contact with Patient 04/03/15 1130     Chief Complaint  Patient presents with  . Leg Pain     (Consider location/radiation/quality/duration/timing/severity/associated sxs/prior Treatment) HPI Pt is a 59yo male presenting to ED with c/o gradually worsening Left knee pain.  Pt was seen on 04/01/15 for same. Pt states he has been having gradually worsening, aching sore, Left knee pain, 6/10, with associated swelling and redness inferior to kneecap that started 4 days ago.  Denies known injury or previous surgery to knee.  Pt has been using a crutch to assist with ambulation as weightbearing worsens pain. Pt states he was prescribed prednisone the other day but did not fill as he was worried he would not be able to afford it. States an RN advised him it should only cost $4, pt plans on filling when discharged today.  Denies knew injuries to knee but states he has a hard time getting around at work where he has to ambulate around large machines.  Pt states he has not f/u with PCP or orthopedist as he has f/u with PCP next Friday, and needs referral to orthopedist.  Denies fever, chills, n/v/d.   Past Medical History  Diagnosis Date  . Hyperlipidemia   . Acid reflux   . Hiatal hernia   . Gout   . DDD (degenerative disc disease)   . Obesity   . Osteopenia   . Diverticulitis large intestine 09/2013    CT proven-sigmoid  . Anxiety   . Heart palpitations   . Hemorrhoids   . Pulmonary nodule   . Diverticulitis of sigmoid colon 10/15/2013   Past Surgical History  Procedure Laterality Date  . Colonoscopy    . Esophagogastroduodenoscopy  12/25/13  . Hemorrhoid banding     Family History  Problem Relation Age of Onset  . Pancreatic cancer Father   . Hypertension Mother   . Hypertension Sister   . Colon cancer Neg Hx   . Stomach cancer Neg Hx    History  Substance Use Topics  . Smoking status: Never Smoker    . Smokeless tobacco: Never Used  . Alcohol Use: No    Review of Systems  Constitutional: Negative for fever and chills.  Musculoskeletal: Positive for myalgias, joint swelling and arthralgias.       Left knee  Skin: Positive for color change. Negative for pallor, rash and wound.  All other systems reviewed and are negative.     Allergies  Aspirin; Lipitor; and Zocor  Home Medications   Prior to Admission medications   Medication Sig Start Date End Date Taking? Authorizing Provider  HYDROcodone-acetaminophen (NORCO/VICODIN) 5-325 MG per tablet Take 1-2 tablets by mouth every 6 hours as needed for pain and/or cough. Patient not taking: Reported on 04/03/2015 04/01/15   Joni ReiningNicole Pisciotta, PA-C  predniSONE (DELTASONE) 20 MG tablet Take 2 tablets (40 mg total) by mouth daily. Patient not taking: Reported on 04/03/2015 04/01/15   Joni ReiningNicole Pisciotta, PA-C   BP 128/77 mmHg  Pulse 60  Temp(Src) 98.3 F (36.8 C) (Oral)  Resp 16  SpO2 100% Physical Exam  Constitutional: He is oriented to person, place, and time. He appears well-developed and well-nourished.  HENT:  Head: Normocephalic and atraumatic.  Eyes: EOM are normal.  Neck: Normal range of motion.  Cardiovascular: Normal rate.   Pulmonary/Chest: Effort normal.  Musculoskeletal: Normal range of motion. He exhibits edema and  tenderness.  Left knee: mild edema over tibial tuberosity with associated tenderness. FROM Left knee. No tenderness in joint space. No posterior knee or calf tenderness.   Neurological: He is alert and oriented to person, place, and time.  Skin: Skin is warm and dry. There is erythema.  Left knee: mild erythema over tibial tuberosity. No induration, fluctuance, or warmth.   Psychiatric: He has a normal mood and affect. His behavior is normal.  Nursing note and vitals reviewed.   ED Course  Procedures (including critical care time) Labs Review Labs Reviewed - No data to display  Imaging Review Dg Knee  Complete 4 Views Left  04/01/2015   CLINICAL DATA:  Knee pain immediately inferior to the patella scratch the left knee pain immediately inferior to the patella. No known injury. Initial encounter.  EXAM: LEFT KNEE - COMPLETE 4+ VIEW  COMPARISON:  None.  FINDINGS: There is no acute bony or joint abnormality. Enthesopathic change of the patellar tendon attachments to the tibia and patella is noted. There is no joint effusion. No evidence of degenerative disease is seen.  IMPRESSION: No acute abnormality.  Enthesopathic change at the attachment site of the patellar tendon.   Electronically Signed   By: Drusilla Kannerhomas  Dalessio M.D.   On: 04/01/2015 15:55     EKG Interpretation None      MDM   Final diagnoses:  Left knee pain   Pt presenting to ED with continued Left knee pain. Pt had plain films 2 days ago which showed enthesopathic change at attachment site of patellar tendon, c/w area of tenderness on exam.  Pt waiting for PCP f/u for ortho referral. No new injury. No evidence of septic joint, pt is afebrile, FROM Left knee. No calf tenderness or posterior knee tenderness, doubt DVT. Encouraged pt to call orthopedist as he has seen within the last 1 year for his back and may still be in the symptoms. Knee sleeve provided today for comfort. Work note provided. Return precautions provided. Pt verbalized understanding and agreement with tx plan.   Junius Finnerrin O'Malley, PA-C 04/03/15 1219  Mirian MoMatthew Gentry, MD 04/05/15 [phone]

## 2015-04-03 NOTE — ED Notes (Signed)
Pt presents with c/o left leg pain that started on Sunday morning. Pt reports he was seen for the same on Monday of this week, pain is not any better. Pt reports he did some research and there are multiple things that could be causing his pain.

## 2015-04-27 ENCOUNTER — Encounter (HOSPITAL_COMMUNITY): Payer: Self-pay

## 2015-04-27 ENCOUNTER — Emergency Department (HOSPITAL_COMMUNITY): Payer: BLUE CROSS/BLUE SHIELD

## 2015-04-27 ENCOUNTER — Emergency Department (HOSPITAL_COMMUNITY)
Admission: EM | Admit: 2015-04-27 | Discharge: 2015-04-27 | Disposition: A | Discharge status: Home / Self Care | Payer: BLUE CROSS/BLUE SHIELD | Attending: Emergency Medicine | Admitting: Emergency Medicine

## 2015-04-27 DIAGNOSIS — M25462 Effusion, left knee: Secondary | ICD-10-CM | POA: Insufficient documentation

## 2015-04-27 DIAGNOSIS — E669 Obesity, unspecified: Secondary | ICD-10-CM | POA: Insufficient documentation

## 2015-04-27 DIAGNOSIS — M25562 Pain in left knee: Secondary | ICD-10-CM

## 2015-04-27 DIAGNOSIS — Z79899 Other long term (current) drug therapy: Secondary | ICD-10-CM | POA: Insufficient documentation

## 2015-04-27 DIAGNOSIS — Z8659 Personal history of other mental and behavioral disorders: Secondary | ICD-10-CM | POA: Insufficient documentation

## 2015-04-27 DIAGNOSIS — K219 Gastro-esophageal reflux disease without esophagitis: Secondary | ICD-10-CM | POA: Insufficient documentation

## 2015-04-27 MED ORDER — NAPROXEN 500 MG PO TABS: 500.0000 mg | ORAL_TABLET | Freq: Two times a day (BID) | ORAL | Status: DC

## 2015-04-27 MED ORDER — HYDROCODONE-ACETAMINOPHEN 5-325 MG PO TABS
1.0000 | ORAL_TABLET | Freq: Once | ORAL | Status: AC
  Administered 2015-04-27: 1 via ORAL
  Filled 2015-04-27: qty 1

## 2015-04-27 MED ORDER — HYDROCODONE-ACETAMINOPHEN 5-325 MG PO TABS: 1.0000 | ORAL_TABLET | Freq: Four times a day (QID) | ORAL | Status: DC | PRN

## 2015-04-27 NOTE — Discharge Instructions (Signed)
You are again seen today for knee pain. You have calcification around your patellar tendon which is likely the cause of your pain. You need follow-up with orthopedist.  Knee Pain The knee is the complex joint between your thigh and your lower leg. It is made up of bones, tendons, ligaments, and cartilage. The bones that make up the knee are:  The femur in the thigh.  The tibia and fibula in the lower leg.  The patella or kneecap riding in the groove on the lower femur. CAUSES  Knee pain is a common complaint with many causes. A few of these causes are:  Injury, such as:  A ruptured ligament or tendon injury.  Torn cartilage.  Medical conditions, such as:  Gout  Arthritis  Infections  Overuse, over training, or overdoing a physical activity. Knee pain can be minor or severe. Knee pain can accompany debilitating injury. Minor knee problems often respond well to self-care measures or get well on their own. More serious injuries may need medical intervention or even surgery. SYMPTOMS The knee is complex. Symptoms of knee problems can vary widely. Some of the problems are:  Pain with movement and weight bearing.  Swelling and tenderness.  Buckling of the knee.  Inability to straighten or extend your knee.  Your knee locks and you cannot straighten it.  Warmth and redness with pain and fever.  Deformity or dislocation of the kneecap. DIAGNOSIS  Determining what is wrong may be very straight forward such as when there is an injury. It can also be challenging because of the complexity of the knee. Tests to make a diagnosis may include:  Your caregiver taking a history and doing a physical exam.  Routine X-rays can be used to rule out other problems. X-rays will not reveal a cartilage tear. Some injuries of the knee can be diagnosed by:  Arthroscopy a surgical technique by which a small video camera is inserted through tiny incisions on the sides of the knee. This procedure  is used to examine and repair internal knee joint problems. Tiny instruments can be used during arthroscopy to repair the torn knee cartilage (meniscus).  Arthrography is a radiology technique. A contrast liquid is directly injected into the knee joint. Internal structures of the knee joint then become visible on X-ray film.  An MRI scan is a non X-ray radiology procedure in which magnetic fields and a computer produce two- or three-dimensional images of the inside of the knee. Cartilage tears are often visible using an MRI scanner. MRI scans have largely replaced arthrography in diagnosing cartilage tears of the knee.  Blood work.  Examination of the fluid that helps to lubricate the knee joint (synovial fluid). This is done by taking a sample out using a needle and a syringe. TREATMENT The treatment of knee problems depends on the cause. Some of these treatments are:  Depending on the injury, proper casting, splinting, surgery, or physical therapy care will be needed.  Give yourself adequate recovery time. Do not overuse your joints. If you begin to get sore during workout routines, back off. Slow down or do fewer repetitions.  For repetitive activities such as cycling or running, maintain your strength and nutrition.  Alternate muscle groups. For example, if you are a weight lifter, work the upper body on one day and the lower body the next.  Either tight or weak muscles do not give the proper support for your knee. Tight or weak muscles do not absorb the stress placed  on the knee joint. Keep the muscles surrounding the knee strong.  Take care of mechanical problems.  If you have flat feet, orthotics or special shoes may help. See your caregiver if you need help.  Arch supports, sometimes with wedges on the inner or outer aspect of the heel, can help. These can shift pressure away from the side of the knee most bothered by osteoarthritis.  A brace called an "unloader" brace also may be  used to help ease the pressure on the most arthritic side of the knee.  If your caregiver has prescribed crutches, braces, wraps or ice, use as directed. The acronym for this is PRICE. This means protection, rest, ice, compression, and elevation.  Nonsteroidal anti-inflammatory drugs (NSAIDs), can help relieve pain. But if taken immediately after an injury, they may actually increase swelling. Take NSAIDs with food in your stomach. Stop them if you develop stomach problems. Do not take these if you have a history of ulcers, stomach pain, or bleeding from the bowel. Do not take without your caregiver's approval if you have problems with fluid retention, heart failure, or kidney problems.  For ongoing knee problems, physical therapy may be helpful.  Glucosamine and chondroitin are over-the-counter dietary supplements. Both may help relieve the pain of osteoarthritis in the knee. These medicines are different from the usual anti-inflammatory drugs. Glucosamine may decrease the rate of cartilage destruction.  Injections of a corticosteroid drug into your knee joint may help reduce the symptoms of an arthritis flare-up. They may provide pain relief that lasts a few months. You may have to wait a few months between injections. The injections do have a small increased risk of infection, water retention, and elevated blood sugar levels.  Hyaluronic acid injected into damaged joints may ease pain and provide lubrication. These injections may work by reducing inflammation. A series of shots may give relief for as long as 6 months.  Topical painkillers. Applying certain ointments to your skin may help relieve the pain and stiffness of osteoarthritis. Ask your pharmacist for suggestions. Many over the-counter products are approved for temporary relief of arthritis pain.  In some countries, doctors often prescribe topical NSAIDs for relief of chronic conditions such as arthritis and tendinitis. A review of  treatment with NSAID creams found that they worked as well as oral medications but without the serious side effects. PREVENTION  Maintain a healthy weight. Extra pounds put more strain on your joints.  Get strong, stay limber. Weak muscles are a common cause of knee injuries. Stretching is important. Include flexibility exercises in your workouts.  Be smart about exercise. If you have osteoarthritis, chronic knee pain or recurring injuries, you may need to change the way you exercise. This does not mean you have to stop being active. If your knees ache after jogging or playing basketball, consider switching to swimming, water aerobics, or other low-impact activities, at least for a few days a week. Sometimes limiting high-impact activities will provide relief.  Make sure your shoes fit well. Choose footwear that is right for your sport.  Protect your knees. Use the proper gear for knee-sensitive activities. Use kneepads when playing volleyball or laying carpet. Buckle your seat belt every time you drive. Most shattered kneecaps occur in car accidents.  Rest when you are tired. SEEK MEDICAL CARE IF:  You have knee pain that is continual and does not seem to be getting better.  SEEK IMMEDIATE MEDICAL CARE IF:  Your knee joint feels hot to the touch  and you have a high fever. MAKE SURE YOU:   Understand these instructions.  Will watch your condition.  Will get help right away if you are not doing well or get worse. Document Released: 08/30/2007 Document Revised: 01/25/2012 Document Reviewed: 08/30/2007 [facility] Patient Information 2015 [place], [place]. This information is not intended to replace advice given to you by your health care provider. Make sure you discuss any questions you have with your health care provider.

## 2015-04-27 NOTE — ED Notes (Signed)
Patient states he thought he was "bit by something 1 month ago" to his left knee, states was seen and evaluated in the ED for same.  Patient states it got better, then it "got worse" last Wednesday.  Patient complaining of pain and swelling to left knee.

## 2015-04-27 NOTE — ED Provider Notes (Signed)
CSN: [id number]     Arrival date & time 04/27/15  0012 History   First MD Initiated Contact with Patient 04/27/15 0259     Chief Complaint  Patient presents with  . Knee Pain     (Consider location/radiation/quality/duration/timing/severity/associated sxs/prior Treatment) HPI  This a 59 year old male who presents with left knee pain. Patient has presented several times for the same complaint. This is his third ED visit. Patient reports acute worsening of pain over the last 2 days while he was at work. 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 Denies any new injury. He reports no swelling to the inferior aspect of the knee. His pain at 9/10. He took Aleve with minimal relief. Denies any fevers.  Past Medical History  Diagnosis Date  . Hyperlipidemia   . Acid reflux   . Hiatal hernia   . Gout   . DDD (degenerative disc disease)   . Obesity   . Osteopenia   . Diverticulitis large intestine 09/2013    CT proven-sigmoid  . Anxiety   . Heart palpitations   . Hemorrhoids   . Pulmonary nodule   . Diverticulitis of sigmoid colon 10/15/2013   Past Surgical History  Procedure Laterality Date  . Colonoscopy    . Esophagogastroduodenoscopy  12/25/13  . Hemorrhoid banding     Family History  Problem Relation Age of Onset  . Pancreatic cancer Father   . Hypertension Mother   . Hypertension Sister   . Colon cancer Neg Hx   . Stomach cancer Neg Hx    History  Substance Use Topics  . Smoking status: Never Smoker   . Smokeless tobacco: Never Used  . Alcohol Use: No    Review of Systems  Constitutional: Negative for fever.  Musculoskeletal:       Left knee pain  All other systems reviewed and are negative.     Allergies  Aspirin; Lipitor; and Zocor  Home Medications   Prior to Admission medications   Medication Sig Start Date End Date Taking? Authorizing Provider  esomeprazole (NEXIUM) 20 MG capsule Take 20 mg by mouth daily at 12 noon.   Yes Historical  Provider, MD  naproxen sodium (ANAPROX) 220 MG tablet Take 220 mg by mouth 2 (two) times daily with a meal.   Yes Historical Provider, MD  HYDROcodone-acetaminophen (NORCO/VICODIN) 5-325 MG per tablet Take 1 tablet by mouth every 6 (six) hours as needed for moderate pain. 04/27/15   Shon Baton, MD  naproxen (NAPROSYN) 500 MG tablet Take 1 tablet (500 mg total) by mouth 2 (two) times daily. 04/27/15   Shon Baton, MD  predniSONE (DELTASONE) 20 MG tablet Take 2 tablets (40 mg total) by mouth daily. Patient not taking: Reported on 04/03/2015 04/01/15   Joni Reining Pisciotta, PA-C   BP 115/57 mmHg  Pulse 62  Temp(Src) 97.7 F (36.5 C) (Oral)  Resp 18  SpO2 97% Physical Exam  Constitutional: He is oriented to person, place, and time. He appears well-developed and well-nourished.  HENT:  Head: Normocephalic and atraumatic.  Cardiovascular: Normal rate and regular rhythm.   Pulmonary/Chest: Effort normal. No respiratory distress.  Musculoskeletal: He exhibits no edema.  Pain with range of motion of the left knee, no overlying redness or erythema, swelling noted over the inferior aspect of the patella over the midline with tenderness to palpation, no overlying skin changes, no joint line tenderness, no laxity, good neurovascular exam distally  Neurological: He is alert and oriented to person, place,  and time.  Skin: Skin is warm and dry.  Psychiatric: He has a normal mood and affect.  Nursing note and vitals reviewed.   ED Course  Procedures (including critical care time) Labs Review Labs Reviewed - No data to display  Imaging Review Dg Knee Complete 4 Views Left  04/27/2015   CLINICAL DATA:  Pain and swelling to left knee. Was seen for the same issue 1 month ago but it worsened 2 days ago.  EXAM: LEFT KNEE - COMPLETE 4+ VIEW  COMPARISON:  04/01/2015  FINDINGS: Negative for fracture, dislocation or radiopaque foreign body. There is no bone lesion or bony destruction. There is mild  calcification at the patellar tendon attachments, consistent with enthesopathy. This is unchanged.  IMPRESSION: No acute findings. Unchanged calcifications at the patellar tendon attachments are consistent with enthesopathy.   Electronically Signed   By: Ellery Plunk M.D.   On: 04/27/2015 04:07     EKG Interpretation None      MDM   Final diagnoses:  Left knee pain    Patient presents with persistent left knee pain. There is swelling just inferior to the patella but no joint space swelling. No signs or symptoms of joint inflammation. Low suspicion at this time for septic joint or gout. X-rays show persistent calcifications of the patellar tendon. This correlates clinically with the patient's tenderness. He has not followed up with orthopedist as he states he cannot afford this. Discussed with patient that this will not likely go away on its own and made persistently bother him. He needs to follow-up so definitive care can be arranged. Patient stated understanding.  After history, exam, and medical workup I feel the patient has been appropriately medically screened and is safe for discharge home. Pertinent diagnoses were discussed with the patient. Patient was given return precautions.     Shon Baton, MD 04/27/15 [phone]

## 2015-04-27 NOTE — ED Notes (Signed)
This is the 3rd ED visit for the left knee complaint

## 2015-07-07 ENCOUNTER — Emergency Department (HOSPITAL_COMMUNITY): Payer: BLUE CROSS/BLUE SHIELD

## 2015-07-07 ENCOUNTER — Emergency Department (HOSPITAL_COMMUNITY)
Admission: EM | Admit: 2015-07-07 | Discharge: 2015-07-07 | Disposition: A | Discharge status: Home / Self Care | Payer: BLUE CROSS/BLUE SHIELD | Attending: Emergency Medicine | Admitting: Emergency Medicine

## 2015-07-07 DIAGNOSIS — N410 Acute prostatitis: Secondary | ICD-10-CM | POA: Insufficient documentation

## 2015-07-07 DIAGNOSIS — N419 Inflammatory disease of prostate, unspecified: Secondary | ICD-10-CM

## 2015-07-07 DIAGNOSIS — K219 Gastro-esophageal reflux disease without esophagitis: Secondary | ICD-10-CM | POA: Insufficient documentation

## 2015-07-07 DIAGNOSIS — Z8739 Personal history of other diseases of the musculoskeletal system and connective tissue: Secondary | ICD-10-CM | POA: Insufficient documentation

## 2015-07-07 DIAGNOSIS — E669 Obesity, unspecified: Secondary | ICD-10-CM | POA: Insufficient documentation

## 2015-07-07 DIAGNOSIS — Z8659 Personal history of other mental and behavioral disorders: Secondary | ICD-10-CM | POA: Insufficient documentation

## 2015-07-07 DIAGNOSIS — R531 Weakness: Secondary | ICD-10-CM | POA: Diagnosis present

## 2015-07-07 LAB — COMPREHENSIVE METABOLIC PANEL
ALK PHOS: 56 U/L (ref 38–126)
ALT: 14 U/L — AB (ref 17–63)
AST: 17 U/L (ref 15–41)
Albumin: 4.5 g/dL (ref 3.5–5.0)
Anion gap: 12 (ref 5–15)
BILIRUBIN TOTAL: 2 mg/dL — AB (ref 0.3–1.2)
BUN: 18 mg/dL (ref 6–20)
CALCIUM: 9.2 mg/dL (ref 8.9–10.3)
CHLORIDE: 103 mmol/L (ref 101–111)
CO2: 22 mmol/L (ref 22–32)
CREATININE: 1.19 mg/dL (ref 0.61–1.24)
Glucose, Bld: 84 mg/dL (ref 65–99)
Potassium: 4.1 mmol/L (ref 3.5–5.1)
Sodium: 137 mmol/L (ref 135–145)
TOTAL PROTEIN: 7.1 g/dL (ref 6.5–8.1)

## 2015-07-07 LAB — CBC
HCT: 46.1 % (ref 39.0–52.0)
Hemoglobin: 15.4 g/dL (ref 13.0–17.0)
MCH: 29.6 pg (ref 26.0–34.0)
MCHC: 33.4 g/dL (ref 30.0–36.0)
MCV: 88.5 fL (ref 78.0–100.0)
PLATELETS: 208 10*3/uL (ref 150–400)
RBC: 5.21 MIL/uL (ref 4.22–5.81)
RDW: 13.2 % (ref 11.5–15.5)
WBC: 12.8 10*3/uL — AB (ref 4.0–10.5)

## 2015-07-07 LAB — URINE MICROSCOPIC-ADD ON

## 2015-07-07 LAB — LIPASE, BLOOD: LIPASE: 29 U/L (ref 22–51)

## 2015-07-07 LAB — URINALYSIS, ROUTINE W REFLEX MICROSCOPIC
BILIRUBIN URINE: NEGATIVE
Glucose, UA: NEGATIVE mg/dL
NITRITE: POSITIVE — AB
PROTEIN: NEGATIVE mg/dL
Specific Gravity, Urine: 1.025 (ref 1.005–1.030)
UROBILINOGEN UA: 0.2 mg/dL (ref 0.0–1.0)
pH: 5.5 (ref 5.0–8.0)

## 2015-07-07 MED ORDER — SULFAMETHOXAZOLE-TRIMETHOPRIM 800-160 MG PO TABS
1.0000 | ORAL_TABLET | Freq: Two times a day (BID) | ORAL | Status: AC
Start: 2015-07-07 — End: 2015-07-14

## 2015-07-07 NOTE — Discharge Instructions (Signed)

## 2015-07-07 NOTE — ED Notes (Signed)
Pt in by [facility]. C/o generalized fatigue, weakness since yesterday, worse today. Pt at church, friends got concerned and called ems. 12 lead unremarkable, VSS.

## 2015-07-07 NOTE — ED Provider Notes (Signed)
CSN: [id number]     Arrival date & time 07/07/15  1237 History   First MD Initiated Contact with Patient 07/07/15 1538     Chief Complaint  Patient presents with  . Fatigue  . Weakness     (Consider location/radiation/quality/duration/timing/severity/associated sxs/prior Treatment) Patient is a 59 y.o. male presenting with weakness. The history is provided by the patient.  Weakness Associated symptoms include abdominal pain. Pertinent negatives include no chest pain, no headaches and no shortness of breath.   patient was at church today when began to feel weak all over. States he did have some mild abdominal pain with it. States he also has had pain in his rectum area. No dysuria. States he has some chills yesterday. States he just began to feel bad at around 10 AM today. No chest pain. No trouble breathing. No nausea vomiting. No diarrhea. No constipation. He has not had episodes like this before. States he has been drinking a fair amount of fluid because his sugar was a little high previously. States he had his A1c checked and it was mildly elevated. States he feels somewhat better now. He has dull abdominal pain. States he has had diverticulitis in the past and this feels nothing like that.  Past Medical History  Diagnosis Date  . Hyperlipidemia   . Acid reflux   . Hiatal hernia   . Gout   . DDD (degenerative disc disease)   . Obesity   . Osteopenia   . Diverticulitis large intestine 09/2013    CT proven-sigmoid  . Anxiety   . Heart palpitations   . Hemorrhoids   . Pulmonary nodule   . Diverticulitis of sigmoid colon 10/15/2013   Past Surgical History  Procedure Laterality Date  . Colonoscopy    . Esophagogastroduodenoscopy  12/25/13  . Hemorrhoid banding     Family History  Problem Relation Age of Onset  . Pancreatic cancer Father   . Hypertension Mother   . Hypertension Sister   . Colon cancer Neg Hx   . Stomach cancer Neg Hx    Social History  Substance Use Topics   . Smoking status: Never Smoker   . Smokeless tobacco: Never Used  . Alcohol Use: No    Review of Systems  Constitutional: Positive for chills and fatigue. Negative for activity change and appetite change.  Eyes: Negative for pain.  Respiratory: Negative for chest tightness and shortness of breath.   Cardiovascular: Negative for chest pain and leg swelling.  Gastrointestinal: Positive for abdominal pain. Negative for nausea, vomiting and diarrhea.  Genitourinary: Negative for flank pain.  Musculoskeletal: Negative for back pain and neck stiffness.  Skin: Negative for rash.  Neurological: Positive for weakness. Negative for numbness and headaches.  Psychiatric/Behavioral: Negative for behavioral problems.      Allergies  Aspirin; Lipitor; and Zocor  Home Medications   Prior to Admission medications   Medication Sig Start Date End Date Taking? Authorizing Provider  Dextrose, Diabetic Use, (GLUCOSE PO) Take 1 tablet by mouth daily as needed (sugar). Natural glucose capsules.   Yes Historical Provider, MD  esomeprazole (NEXIUM) 20 MG capsule Take 20 mg by mouth daily as needed (indigestion.).    Yes Historical Provider, MD  HYDROcodone-acetaminophen (NORCO/VICODIN) 5-325 MG per tablet Take 1 tablet by mouth every 6 (six) hours as needed for moderate pain. Patient not taking: Reported on 07/07/2015 04/27/15   Shon Baton, MD  naproxen (NAPROSYN) 500 MG tablet Take 1 tablet (500 mg total) by mouth 2 (  two) times daily. Patient not taking: Reported on 07/07/2015 04/27/15   Shon Baton, MD  predniSONE (DELTASONE) 20 MG tablet Take 2 tablets (40 mg total) by mouth daily. Patient not taking: Reported on 04/03/2015 04/01/15   Joni Reining Pisciotta, PA-C  sulfamethoxazole-trimethoprim (BACTRIM DS,SEPTRA DS) 800-160 MG per tablet Take 1 tablet by mouth 2 (two) times daily. 07/07/15 07/14/15  Benjiman Core, MD   BP 132/69 mmHg  Pulse 70  Temp(Src) 97.8 F (36.6 C) (Oral)  Resp 16  SpO2  99% Physical Exam  Constitutional: He appears well-developed.  HENT:  Head: Atraumatic.  Neck: Neck supple.  Cardiovascular: Normal rate.   Pulmonary/Chest: Effort normal.  Abdominal: Soft. There is no tenderness.  Genitourinary:  Tender over prostate on rectal exam.  Neurological: He is alert.  Skin: Skin is warm.    ED Course  Procedures (including critical care time) Labs Review Labs Reviewed  COMPREHENSIVE METABOLIC PANEL - Abnormal; Notable for the following:    ALT 14 (*)    Total Bilirubin 2.0 (*)    All other components within normal limits  CBC - Abnormal; Notable for the following:    WBC 12.8 (*)    All other components within normal limits  URINALYSIS, ROUTINE W REFLEX MICROSCOPIC (NOT AT [facility]) - Abnormal; Notable for the following:    APPearance CLOUDY (*)    Hgb urine dipstick SMALL (*)    Ketones, ur >80 (*)    Nitrite POSITIVE (*)    Leukocytes, UA MODERATE (*)    All other components within normal limits  URINE MICROSCOPIC-ADD ON - Abnormal; Notable for the following:    Bacteria, UA MANY (*)    All other components within normal limits  URINE CULTURE  LIPASE, BLOOD    Imaging Review Dg Abd Acute W/chest  07/07/2015   CLINICAL DATA:  Bloating and constipation, 2-3 days duration.  EXAM: DG ABDOMEN ACUTE W/ 1V CHEST  COMPARISON:  CT 03/15/2014  FINDINGS: Bowel gas pattern is normal without evidence of ileus, obstruction or free air. No worrisome calcifications are bony findings.  One-view chest shows normal heart and mediastinal shadows. The lungs are clear. No free air under the diaphragm.  IMPRESSION: Negative abdominal radiographs.  No acute cardiopulmonary disease.   Electronically Signed   By: Paulina Fusi M.D.   On: 07/07/2015 14:23   I have personally reviewed and evaluated these images and lab results as part of my medical decision-making.   EKG Interpretation   Date/Time:  Sunday July 07 2015 12:42:56 EDT Ventricular Rate:  66 PR Interval:   139 QRS Duration: 99 QT Interval:  390 QTC Calculation: 409 R Axis:   7 Text Interpretation:  Sinus rhythm Abnormal R-wave progression, early  transition Confirmed by Rubin Payor  MD, Harrold Donath [phone]) on 07/07/2015 4:56:50  PM      MDM   Final diagnoses:  Prostatitis, unspecified prostatitis type    ` Patient with generalized weakness. Has apparent prostatitis with possible UTI and prostate tenderness. Work reassuring except for ketones in the urine. He is tolerating orals and will increase his oral intake. Will discharge home to follow-up his primary care doctor.    Benjiman Core, MD 07/07/15 [phone]

## 2015-07-07 NOTE — ED Notes (Signed)
Pt c/o abdominal and rectal pain. No nausea, vomiting or diarrhea.  Denies fevers.  Appetite is good.

## 2015-07-07 NOTE — ED Notes (Signed)
Pt reports very vague abd discomfort, pointing to R and L mid quadrants as sources. Also c/o rectal pain, all intermittent. Has Hx of hemorrhoids, but unsure if this is the source of his pain today. Also Hx of diverticulitis but does not feel that this feels similar. Over last week, will feel as if he needs to have BM but only has small "pellets."

## 2015-07-09 LAB — URINE CULTURE: Culture: >=100000

## 2015-07-10 ENCOUNTER — Telehealth (HOSPITAL_BASED_OUTPATIENT_CLINIC_OR_DEPARTMENT_OTHER): Payer: Self-pay | Admitting: Emergency Medicine

## 2015-07-10 NOTE — Telephone Encounter (Addendum)
Post ED Visit - Positive Culture Follow-up  Culture report reviewed by antimicrobial stewardship pharmacist:  Wes Dulaney, Pharm.D., BCPS  Celedonio Miyamoto, [address].D., BCPS  Georgina Pillion, [address].D., BCPS  [place], [address].D., BCPS, AAHIVP  Estella Husk, Pharm.D., BCPS, AAHIVP  Elder Cyphers, [address].D., BCPS Casilda Carls PharmD  Positive urine culture Klebsiella Treated with bactrim DS, organism sensitive to the same and no further patient follow-up is required at this time.  Berle Mull 07/10/2015, 8:54 AM

## 2015-12-19 ENCOUNTER — Encounter: Payer: Self-pay | Admitting: Internal Medicine

## 2015-12-19 ENCOUNTER — Ambulatory Visit (INDEPENDENT_AMBULATORY_CARE_PROVIDER_SITE_OTHER): Payer: BLUE CROSS/BLUE SHIELD | Admitting: Internal Medicine

## 2015-12-19 VITALS — BP 118/60 | HR 68 | Ht 65.0 in | Wt 195.0 lb

## 2015-12-19 DIAGNOSIS — N4282 Prostatosis syndrome: Secondary | ICD-10-CM

## 2015-12-19 DIAGNOSIS — K6289 Other specified diseases of anus and rectum: Secondary | ICD-10-CM

## 2015-12-19 NOTE — Patient Instructions (Signed)
   I do not think your problems are from hemorrhoids. Keep follow-up with Dr. Annabell Howells and see me as needed please.  I appreciate the opportunity to care for you. Iva Boop, MD, Clementeen Graham

## 2015-12-19 NOTE — Progress Notes (Signed)
   Subjective:    Patient ID: Griffen Frayne, male    DOB: 03-21-1956, 60 y.o.   MRN: [id number] Chief complaint: Rectal pain HPI Mr. Musson was diagnosed with prostatitis a few months ago. He is having intermittent sharp rectal pains not associated with defecation sometimes before. They're originally associated with urination at the time of diagnosis of prostatitis. He was diagnosed at an ER visit. He subsequently followed up with Dr. Annabell Howells of urology and his PSA is elevated so there plans to recheck that later this month. Her rectal bleeding no swollen protruding hemorrhoids. He has had banding of hemorrhoids previously. Medications, allergies, past medical history, past surgical history, family history and social history are reviewed and updated in the EMR.  Review of Systems As above    Objective:   Physical Exam BP 118/60 mmHg  Pulse 68  Ht  (1.651 m)  Wt 195 lb (88.451 kg)  BMI 32.45 kg/m2 Rectal exam reveals no perianal changes or tenderness. The exam is completely nontender with a normal prostate that was smooth and perhaps slightly boggy but nontender. There is no rectal mass.     Assessment & Plan:   1. Rectal pain   2. Prostatitis syndrome    I do not think his problems are hemorrhoidal in nature and he does not have a fissure. He will see me as needed and keep urology follow-up.  I will send a copy to Dr. Annabell Howells CC: Leanor Rubenstein, MD

## 2016-05-22 ENCOUNTER — Encounter (HOSPITAL_COMMUNITY): Payer: Self-pay | Admitting: Emergency Medicine

## 2016-05-22 ENCOUNTER — Emergency Department (HOSPITAL_COMMUNITY)
Admission: EM | Admit: 2016-05-22 | Discharge: 2016-05-22 | Disposition: A | Discharge status: Home / Self Care | Payer: BLUE CROSS/BLUE SHIELD | Attending: Emergency Medicine | Admitting: Emergency Medicine

## 2016-05-22 DIAGNOSIS — M79604 Pain in right leg: Secondary | ICD-10-CM | POA: Insufficient documentation

## 2016-05-22 DIAGNOSIS — M79605 Pain in left leg: Secondary | ICD-10-CM | POA: Diagnosis not present

## 2016-05-22 DIAGNOSIS — E785 Hyperlipidemia, unspecified: Secondary | ICD-10-CM | POA: Diagnosis not present

## 2016-05-22 DIAGNOSIS — M545 Low back pain: Secondary | ICD-10-CM | POA: Diagnosis not present

## 2016-05-22 DIAGNOSIS — Z79899 Other long term (current) drug therapy: Secondary | ICD-10-CM | POA: Insufficient documentation

## 2016-05-22 DIAGNOSIS — Z7952 Long term (current) use of systemic steroids: Secondary | ICD-10-CM | POA: Diagnosis not present

## 2016-05-22 DIAGNOSIS — M519 Unspecified thoracic, thoracolumbar and lumbosacral intervertebral disc disorder: Secondary | ICD-10-CM | POA: Insufficient documentation

## 2016-05-22 MED ORDER — NAPROXEN 500 MG PO TABS: 500.0000 mg | ORAL_TABLET | Freq: Two times a day (BID) | ORAL | Status: DC

## 2016-05-22 NOTE — ED Notes (Addendum)
Patient presents for intermittent bilateral leg pain, radiating from lower buttocks to mid calf x2-3 months. Denies increased swelling, extremities warm to touch, color appropriate for ethnicity. A&O x4, ambulatory with steady gait.

## 2016-05-22 NOTE — ED Notes (Signed)
Pt informed that MD would be a while.  Pt also educated on the abilities of the ED and pt expressed understanding.

## 2016-05-22 NOTE — Discharge Instructions (Signed)
Please take your medications as prescribed. Follow-up with your doctor in 1 week for reevaluation. Return to ED for new or worsening symptoms.  Musculoskeletal Pain Musculoskeletal pain is muscle and boney aches and pains. These pains can occur in any part of the body. Your caregiver may treat you without knowing the cause of the pain. They may treat you if blood or urine tests, X-rays, and other tests were normal.  CAUSES There is often not a definite cause or reason for these pains. These pains may be caused by a type of germ (virus). The discomfort may also come from overuse. Overuse includes working out too hard when your body is not fit. Boney aches also come from weather changes. Bone is sensitive to atmospheric pressure changes. HOME CARE INSTRUCTIONS   Ask when your test results will be ready. Make sure you get your test results.  Only take over-the-counter or prescription medicines for pain, discomfort, or fever as directed by your caregiver. If you were given medications for your condition, do not drive, operate machinery or power tools, or sign legal documents for 24 hours. Do not drink alcohol. Do not take sleeping pills or other medications that may interfere with treatment.  Continue all activities unless the activities cause more pain. When the pain lessens, slowly resume normal activities. Gradually increase the intensity and duration of the activities or exercise.  During periods of severe pain, bed rest may be helpful. Lay or sit in any position that is comfortable.  Putting ice on the injured area.  Put ice in a bag.  Place a towel between your skin and the bag.  Leave the ice on for 15 to 20 minutes, 3 to 4 times a day.  Follow up with your caregiver for continued problems and no reason can be found for the pain. If the pain becomes worse or does not go away, it may be necessary to repeat tests or do additional testing. Your caregiver may need to look further for a  possible cause. SEEK IMMEDIATE MEDICAL CARE IF:  You have pain that is getting worse and is not relieved by medications.  You develop chest pain that is associated with shortness or breath, sweating, feeling sick to your stomach (nauseous), or throw up (vomit).  Your pain becomes localized to the abdomen.  You develop any new symptoms that seem different or that concern you. MAKE SURE YOU:   Understand these instructions.  Will watch your condition.  Will get help right away if you are not doing well or get worse.   This information is not intended to replace advice given to you by your health care provider. Make sure you discuss any questions you have with your health care provider.   Document Released: 11/02/2005 Document Revised: 01/25/2012 Document Reviewed: 07/07/2013 Elsevier Interactive Patient Education [employer]2016 [employer] Inc.

## 2016-05-22 NOTE — ED Notes (Signed)
Pt would like a second opinion from the MD.  Romeo AppleBen, [place] made aware.  Dr. Blinda LeatherwoodPollina currently assessing another pt.

## 2016-05-22 NOTE — ED Provider Notes (Signed)
CSN: [id number]     Arrival date & time 05/22/16  0145 History   First MD Initiated Contact with Patient 05/22/16 0155     No chief complaint on file.    (Consider location/radiation/quality/duration/timing/severity/associated sxs/prior Treatment) HPI Jerry Larson is a 60 y.o. male who is in for evaluation of intermittent low back and bilateral leg pain radiating into her lower buttock's ongoing since April. Denies any injury, unilateral leg swelling, fevers or chills, numbness or weakness, loss of urinary or bowel function, IV drug use. Has not tried anything to improve his symptoms. Patient does report he went to a chiropractor and was told he had tight hamstrings. He reports stretching which seems to momentarily relieve his discomfort. Discomfort is rated as 3/10. Worse with certain movements. No other modifying factors.  Past Medical History  Diagnosis Date  . Hyperlipidemia   . Acid reflux   . Hiatal hernia   . Gout   . DDD (degenerative disc disease)   . Obesity   . Osteopenia   . Diverticulitis large intestine 09/2013    CT proven-sigmoid  . Anxiety   . Heart palpitations   . Hemorrhoids   . Pulmonary nodule   . Diverticulitis of sigmoid colon 10/15/2013   Past Surgical History  Procedure Laterality Date  . Colonoscopy    . Esophagogastroduodenoscopy  12/25/13  . Hemorrhoid banding     Family History  Problem Relation Age of Onset  . Pancreatic cancer Father   . Hypertension Mother   . Hypertension Sister   . Colon cancer Neg Hx   . Stomach cancer Neg Hx    Social History  Substance Use Topics  . Smoking status: Never Smoker   . Smokeless tobacco: Never Used  . Alcohol Use: No    Review of Systems A 10 point review of systems was completed and was negative except for pertinent positives and negatives as mentioned in the history of present illness     Allergies  Aspirin; Lipitor; and Zocor  Home Medications   Prior to Admission medications   Medication  Sig Start Date End Date Taking? Authorizing Provider  esomeprazole (NEXIUM) 20 MG capsule Take 20 mg by mouth daily as needed (indigestion.). Reported on 12/19/2015    Historical Provider, MD  HYDROcodone-acetaminophen (NORCO/VICODIN) 5-325 MG per tablet Take 1 tablet by mouth every 6 (six) hours as needed for moderate pain. Patient not taking: Reported on 07/07/2015 04/27/15   Shon Batonourtney F Horton, MD  naproxen (NAPROSYN) 500 MG tablet Take 1 tablet (500 mg total) by mouth 2 (two) times daily. 05/22/16   Joycie PeekBenjamin Jocelyn Nold, PA-C  predniSONE (DELTASONE) 20 MG tablet Take 2 tablets (40 mg total) by mouth daily. Patient not taking: Reported on 04/03/2015 04/01/15   Joni ReiningNicole Pisciotta, PA-C   BP 152/76 mmHg  Pulse 85  Temp(Src) 98 F (36.7 C) (Oral)  Resp 16  Ht 5\' 5"  (1.651 m)  Wt 98.884 kg  BMI 36.28 kg/m2  SpO2 98% Physical Exam  Constitutional: He appears well-developed and well-nourished. No distress.  HENT:  Head: Normocephalic and atraumatic.  Eyes: Conjunctivae and EOM are normal. Right eye exhibits no discharge. Left eye exhibits no discharge. No scleral icterus.  Neck: Normal range of motion. Neck supple.  Pulmonary/Chest: Effort normal. No respiratory distress.  Abdominal: Soft. He exhibits no distension. There is no tenderness.  Musculoskeletal: Normal range of motion.  Diffuse tenderness in lumbar paraspinal musculature as well as hamstrings. No focal midline bony or spinous process tenderness. No crepitus,  tenting, rash or other skin abnormality. Full active range of motion of CTL spine. Full active range of motion of all extremities.  Neurological:  Motor strength and sensation appear baseline for patient. Able to stand on tiptoe, dorsiflex great toe. Gait baseline.  Skin: He is not diaphoretic.    ED Course  Procedures (including critical care time) Labs Review Labs Reviewed - No data to display  Imaging Review No results found. I have personally reviewed and evaluated these  images and lab results as part of my medical decision-making.   EKG Interpretation None      MDM  Patient with back pain.  Likely MSK in nature. Ongoing since April. No neurological deficits and normal neuro exam.  Patient can walk but states is painful.  No loss of bowel or bladder control.  No concern for cauda equina.  No fever, night sweats, weight loss, h/o cancer, IVDU.  RICE protocol and NSAIDs indicated and discussed with patient. Discussed follow-up with PCP in one week.  Final diagnoses:  Bilateral leg pain       Joycie PeekBenjamin Lakeena Downie, PA-C 05/22/16 [id number]  Gilda Creasehristopher J Pollina, MD 05/22/16 [phone]

## 2016-07-21 ENCOUNTER — Other Ambulatory Visit: Payer: Self-pay | Admitting: Orthopedic Surgery

## 2016-07-28 ENCOUNTER — Inpatient Hospital Stay (HOSPITAL_COMMUNITY): Admission: RE | Admit: 2016-07-28 | Payer: BLUE CROSS/BLUE SHIELD | Source: Ambulatory Visit

## 2016-07-30 ENCOUNTER — Inpatient Hospital Stay: Admit: 2016-07-30 | Payer: BLUE CROSS/BLUE SHIELD | Admitting: Orthopedic Surgery

## 2016-07-30 SURGERY — POSTERIOR LUMBAR FUSION 1 LEVEL
Anesthesia: General

## 2016-12-09 ENCOUNTER — Emergency Department (HOSPITAL_COMMUNITY)
Admission: EM | Admit: 2016-12-09 | Discharge: 2016-12-09 | Disposition: A | Discharge status: Home / Self Care | Payer: BLUE CROSS/BLUE SHIELD | Attending: Emergency Medicine | Admitting: Emergency Medicine

## 2016-12-09 ENCOUNTER — Encounter (HOSPITAL_COMMUNITY): Payer: Self-pay | Admitting: Emergency Medicine

## 2016-12-09 ENCOUNTER — Emergency Department (HOSPITAL_COMMUNITY): Payer: BLUE CROSS/BLUE SHIELD

## 2016-12-09 DIAGNOSIS — R0789 Other chest pain: Secondary | ICD-10-CM

## 2016-12-09 DIAGNOSIS — R072 Precordial pain: Secondary | ICD-10-CM | POA: Insufficient documentation

## 2016-12-09 LAB — CBC
HEMATOCRIT: 43.6 % (ref 39.0–52.0)
HEMOGLOBIN: 14.5 g/dL (ref 13.0–17.0)
MCH: 29.6 pg (ref 26.0–34.0)
MCHC: 33.3 g/dL (ref 30.0–36.0)
MCV: 89 fL (ref 78.0–100.0)
Platelets: 263 10*3/uL (ref 150–400)
RBC: 4.9 MIL/uL (ref 4.22–5.81)
RDW: 13.4 % (ref 11.5–15.5)
WBC: 11.9 10*3/uL — ABNORMAL HIGH (ref 4.0–10.5)

## 2016-12-09 LAB — BASIC METABOLIC PANEL
ANION GAP: 13 (ref 5–15)
BUN: 16 mg/dL (ref 6–20)
CO2: 25 mmol/L (ref 22–32)
Calcium: 9.6 mg/dL (ref 8.9–10.3)
Chloride: 99 mmol/L — ABNORMAL LOW (ref 101–111)
Creatinine, Ser: 1.31 mg/dL — ABNORMAL HIGH (ref 0.61–1.24)
GFR calc Af Amer: >60 mL/min (ref >60)
GFR, EST NON AFRICAN AMERICAN: 57 mL/min — AB (ref >60)
Glucose, Bld: 96 mg/dL (ref 65–99)
POTASSIUM: 4.5 mmol/L (ref 3.5–5.1)
Sodium: 137 mmol/L (ref 135–145)

## 2016-12-09 LAB — I-STAT TROPONIN, ED: Troponin i, poc: 0 ng/mL (ref 0.00–0.08)

## 2016-12-09 MED ORDER — PANTOPRAZOLE SODIUM 40 MG PO TBEC
40.0000 mg | DELAYED_RELEASE_TABLET | Freq: Once | ORAL | Status: AC
  Administered 2016-12-09: 40 mg via ORAL
  Filled 2016-12-09: qty 1

## 2016-12-09 MED ORDER — PANTOPRAZOLE SODIUM 40 MG PO TBEC: 40.0000 mg | DELAYED_RELEASE_TABLET | Freq: Every day | ORAL | 0 refills | Status: AC

## 2016-12-09 NOTE — ED Triage Notes (Signed)
Pt. reports intermittent central chest pain onset last night with mild SOB , no nausea or diaphoresis , he added headache and nasal congestion , denies fever or chills.

## 2016-12-09 NOTE — ED Provider Notes (Signed)
[facility]PT Provider Note   CSN: [id number] Arrival date & time: 12/09/16  0257     History   Chief Complaint Chief Complaint  Patient presents with  . Chest Pain    HPI Jerry Larson is a 61 y.o. male.  He has been having sharp, momentary midsternal chest pains since this afternoon. Pain started after he went to a drugstore and had his blood pressure checked and found that it was 150/80. He also is complaining of intermittent burning pain in the tip of his nose and the top of his head. Pains only last one-2 seconds before resolving. He rates them at 3/10 when present. There is no associated dyspnea, nausea, diaphoresis. He does have a history of hyperlipidemia but no history of diabetes or hypertension, and he is a nonsmoker. At home, he drank some cider vinegar and 83 stalks of celery. Following this, all of his symptoms have completely resolved. Of note, he does have a history of gastroesophageal reflux disease and is not currently on any medication for it.   The history is provided by the patient.  Chest Pain      Past Medical History:  Diagnosis Date  . Acid reflux   . Anxiety   . DDD (degenerative disc disease)   . Diverticulitis large intestine 09/2013   CT proven-sigmoid  . Diverticulitis of sigmoid colon 10/15/2013  . Gout   . Heart palpitations   . Hemorrhoids   . Hiatal hernia   . Hyperlipidemia   . Obesity   . Osteopenia   . Pulmonary nodule     Patient Active Problem List   Diagnosis Date Noted  . Diverticulosis of colon without hemorrhage 06/20/2014  . Pulmonary nodules 05/25/2014  . Cough 05/25/2014  . Hemorrhoids, internal, with bleeding 02/01/2014  . GERD (gastroesophageal reflux disease) 09/28/2013  . Dyslipidemia 09/01/2013  . Chest pain 09/01/2013  . Palpitations 09/01/2013  . Gout     Past Surgical History:  Procedure Laterality Date  . COLONOSCOPY    . ESOPHAGOGASTRODUODENOSCOPY  12/25/13  . HEMORRHOID BANDING         Home  Medications    Prior to Admission medications   Medication Sig Start Date End Date Taking? Authorizing Provider  esomeprazole (NEXIUM) 20 MG capsule Take 20 mg by mouth daily as needed (indigestion.). Reported on 12/19/2015    Historical Provider, MD  HYDROcodone-acetaminophen (NORCO/VICODIN) 5-325 MG per tablet Take 1 tablet by mouth every 6 (six) hours as needed for moderate pain. Patient not taking: Reported on 07/07/2015 04/27/15   Shon Baton, MD  naproxen (NAPROSYN) 500 MG tablet Take 1 tablet (500 mg total) by mouth 2 (two) times daily. 05/22/16   Joycie Peek, PA-C  predniSONE (DELTASONE) 20 MG tablet Take 2 tablets (40 mg total) by mouth daily. Patient not taking: Reported on 04/03/2015 04/01/15   Wynetta Emery, PA-C    Family History Family History  Problem Relation Age of Onset  . Pancreatic cancer Father   . Hypertension Mother   . Hypertension Sister   . Colon cancer Neg Hx   . Stomach cancer Neg Hx     Social History Social History  Substance Use Topics  . Smoking status: Never Smoker  . Smokeless tobacco: Never Used  . Alcohol use No     Allergies   Aspirin; Lipitor [atorvastatin calcium]; and Zocor [simvastatin]   Review of Systems Review of Systems  Cardiovascular: Positive for chest pain.  All other systems reviewed and are negative.  Physical Exam Updated Vital Signs BP 131/74 (BP Location: Left Arm)   Pulse 70   Temp 97.7 F (36.5 C) (Oral)   Resp 16   Ht 5\' 5"  (1.651 m)   Wt 222 lb (100.7 kg)   SpO2 97%   BMI 36.94 kg/m   Physical Exam  Nursing note and vitals reviewed.  61 year old male, resting comfortably and in no acute distress. Vital signs are normal. Oxygen saturation is 97%, which is normal. Head is normocephalic and atraumatic. PERRLA, EOMI. Oropharynx is clear. Neck is nontender and supple without adenopathy or JVD. Back is nontender and there is no CVA tenderness. Lungs are clear without rales, wheezes, or  rhonchi. Chest is nontender. Heart has regular rate and rhythm without murmur. Abdomen is soft, flat, nontender without masses or hepatosplenomegaly and peristalsis is normoactive. Extremities have no cyanosis or edema, full range of motion is present. Skin is warm and dry without rash. Neurologic: Mental status is normal, cranial nerves are intact, there are no motor or sensory deficits.  ED Treatments / Results  Labs (all labs ordered are listed, but only abnormal results are displayed) Labs Reviewed  BASIC METABOLIC PANEL - Abnormal; Notable for the following:       Result Value   Chloride 99 (*)    Creatinine, Ser 1.31 (*)    GFR calc non Af Amer 57 (*)    All other components within normal limits  CBC - Abnormal; Notable for the following:    WBC 11.9 (*)    All other components within normal limits  I-STAT TROPOININ, ED    EKG  EKG Interpretation  Date/Time:  Wednesday December 09 2016 03:16:11 EST Ventricular Rate:  70 PR Interval:  136 QRS Duration: 88 QT Interval:  400 QTC Calculation: 432 R Axis:   -13 Text Interpretation:  Normal sinus rhythm Minimal voltage criteria for LVH, may be normal variant Borderline ECG When compared with ECG of 07/07/2015, No significant change was found Confirmed by [facility]  MD, Marquies Wanat ([id number]) on 12/09/2016 3:46:06 AM       Radiology Dg Chest 2 View  Result Date: 12/09/2016 CLINICAL DATA:  61 year old male with chest pain EXAM: CHEST  2 VIEW COMPARISON:  Chest radiograph dated 07/07/2015 FINDINGS: The heart size and mediastinal contours are within normal limits. Both lungs are clear. The visualized skeletal structures are unremarkable. IMPRESSION: No active cardiopulmonary disease. Electronically Signed   By: Elgie CollardArash  Radparvar M.D.   On: 12/09/2016 03:44    Procedures Procedures (including critical care time)  Medications Ordered in ED Medications  pantoprazole (PROTONIX) EC tablet 40 mg (not administered)     Initial Impression /  Assessment and Plan / ED Course  I have reviewed the triage vital signs and the nursing notes.  Pertinent labs & imaging results that were available during my care of the patient were reviewed by me and considered in my medical decision making (see chart for details).  Chest pain which is quite atypical. Old records are reviewed, and he had been evaluated by cardiologist in 2014 for chest pain which was felt to be due to gastroesophageal reflux. Current pain does not have any historical factors which sound cardiac and ECG is unremarkable. Chest x-ray is normal and troponin is negative. He is discharged with prescription for pantoprazole and is to follow-up with his PCP.  Final Clinical Impressions(s) / ED Diagnoses   Final diagnoses:  Atypical chest pain    New Prescriptions New Prescriptions  PANTOPRAZOLE (PROTONIX) 40 MG TABLET    Take 1 tablet (40 mg total) by mouth daily.     [name], MD 12/09/16 [phone]

## 2016-12-09 NOTE — ED Notes (Signed)
Pt verbalized understanding of d/c instructions and has no further questions. Pt is stable, A&Ox4, VSS.  

## 2016-12-13 ENCOUNTER — Emergency Department (HOSPITAL_COMMUNITY)
Admission: EM | Admit: 2016-12-13 | Discharge: 2016-12-13 | Disposition: A | Discharge status: Home / Self Care | Payer: BLUE CROSS/BLUE SHIELD | Attending: Emergency Medicine | Admitting: Emergency Medicine

## 2016-12-13 ENCOUNTER — Encounter (HOSPITAL_COMMUNITY): Payer: Self-pay | Admitting: Emergency Medicine

## 2016-12-13 ENCOUNTER — Emergency Department (HOSPITAL_COMMUNITY): Payer: BLUE CROSS/BLUE SHIELD

## 2016-12-13 DIAGNOSIS — J111 Influenza due to unidentified influenza virus with other respiratory manifestations: Secondary | ICD-10-CM | POA: Insufficient documentation

## 2016-12-13 DIAGNOSIS — J189 Pneumonia, unspecified organism: Secondary | ICD-10-CM | POA: Insufficient documentation

## 2016-12-13 DIAGNOSIS — R69 Illness, unspecified: Secondary | ICD-10-CM

## 2016-12-13 MED ORDER — AZITHROMYCIN 250 MG PO TABS: ORAL_TABLET | ORAL | 0 refills | Status: DC

## 2016-12-13 NOTE — ED Provider Notes (Signed)
[facility] Provider Note   CSN: [id number] Arrival date & time: 12/13/16  1524   By signing my name below, I, Jerry Larson, attest that this documentation has been prepared under the direction and in the presence of Jerry Grizzle, MD  Electronically Signed: Clovis Larson, ED Scribe. 12/13/16. 5:42 PM.   History   Chief Complaint Chief Complaint  Patient presents with  . URI   The history is provided by the patient. No language interpreter was used.   HPI Comments:  Jerry Larson is a 61 y.o. male, with a hx of pulmonary nodules, who presents to the Emergency Department complaining of persistent sore throat x 4 days. He states his symptoms began with a headache and also reports a productive cough. Pt denies fevers, chills and any other associated symptoms. Pt has an appointment with his PCP for a prostate check up on 12/16/2016.  Pt is a non-smoker.   Per chest review, pt was seen in the ED on 01.24/2018 for chest pain. He had a normal chest X-Muriel Hannold, negative troponin, was discharged with pantoprazole and advised to follow up with his PCP.   PCP: Leanor Rubenstein, MD  Past Medical History:  Diagnosis Date  . Acid reflux   . Anxiety   . DDD (degenerative disc disease)   . Diverticulitis large intestine 09/2013   CT proven-sigmoid  . Diverticulitis of sigmoid colon 10/15/2013  . Gout   . Heart palpitations   . Hemorrhoids   . Hiatal hernia   . Hyperlipidemia   . Obesity   . Osteopenia   . Pulmonary nodule     Patient Active Problem List   Diagnosis Date Noted  . Diverticulosis of colon without hemorrhage 06/20/2014  . Pulmonary nodules 05/25/2014  . Cough 05/25/2014  . Hemorrhoids, internal, with bleeding 02/01/2014  . GERD (gastroesophageal reflux disease) 09/28/2013  . Dyslipidemia 09/01/2013  . Chest pain 09/01/2013  . Palpitations 09/01/2013  . Gout     Past Surgical History:  Procedure Laterality Date  . COLONOSCOPY    . ESOPHAGOGASTRODUODENOSCOPY   12/25/13  . HEMORRHOID BANDING         Home Medications    Prior to Admission medications   Medication Sig Start Date End Date Taking? Authorizing Provider  esomeprazole (NEXIUM) 20 MG capsule Take 20 mg by mouth daily as needed (indigestion.). Reported on 12/19/2015    Historical Provider, MD  naproxen (NAPROSYN) 500 MG tablet Take 1 tablet (500 mg total) by mouth 2 (two) times daily. 05/22/16   Joycie Peek, PA-C  pantoprazole (PROTONIX) 40 MG tablet Take 1 tablet (40 mg total) by mouth daily. 12/09/16   [name], MD    Family History Family History  Problem Relation Age of Onset  . Pancreatic cancer Father   . Hypertension Mother   . Hypertension Sister   . Colon cancer Neg Hx   . Stomach cancer Neg Hx     Social History Social History  Substance Use Topics  . Smoking status: Never Smoker  . Smokeless tobacco: Never Used  . Alcohol use No     Allergies   Aspirin; Lipitor [atorvastatin calcium]; and Zocor [simvastatin]   Review of Systems Review of Systems  Constitutional: Negative for chills and fever.  HENT: Positive for congestion and sore throat.   Respiratory: Positive for cough.   Neurological: Positive for headaches.  All other systems reviewed and are negative.  Physical Exam Updated Vital Signs BP 123/76 (BP Location: Right Arm)   Pulse 67  Temp 99 F (37.2 C) (Oral)   Resp 20   SpO2 98%   Physical Exam  Constitutional: He is oriented to person, place, and time. He appears well-developed and well-nourished. No distress.  HENT:  Head: Normocephalic and atraumatic.  Eyes: Conjunctivae are normal.  Cardiovascular: Normal rate.   Pulmonary/Chest: Effort normal.  Abdominal: He exhibits no distension.  Neurological: He is alert and oriented to person, place, and time.  Skin: Skin is warm and dry.  Psychiatric: He has a normal mood and affect.  Nursing note and vitals reviewed.  ED Treatments / Results  DIAGNOSTIC STUDIES:  Oxygen  Saturation is 98% on RA, normal by my interpretation.    COORDINATION OF CARE:  5:39 PM Discussed treatment plan with pt at bedside and pt agreed to plan.  Labs (all labs ordered are listed, but only abnormal results are displayed) Labs Reviewed - No data to display  EKG  EKG Interpretation None       Radiology Dg Chest 2 View  Result Date: 12/13/2016 CLINICAL DATA:  Productive cough and sinus pressure 4 days. EXAM: CHEST  2 VIEW COMPARISON:  12/09/2016 FINDINGS: Lungs are adequately inflated with mild airspace density best seen on the lateral film in the posterior lung bases which may be due to vascular crowding although cannot exclude early infection. No evidence of effusion. Borderline stable cardiomegaly. Remainder of the exam is unchanged. IMPRESSION: Mild opacification of the posterior lung bases on the lateral film which may be due to vascular crowding versus early infection. Electronically Signed   By: Elberta Fortisaniel  Boyle M.D.   On: 12/13/2016 16:25    Procedures Procedures (including critical care time)  Medications Ordered in ED Medications - No data to display   Initial Impression / Assessment and Plan / ED Course  I have reviewed the triage vital signs and the nursing notes.  Pertinent labs & imaging results that were available during my care of the patient were reviewed by me and considered in my medical decision making (see chart for details).     Plan z-pack.  F/u pmd this week return if worse at any time.   Final Clinical Impressions(s) / ED Diagnoses   Final diagnoses:  Influenza-like illness  Lung infection    New Prescriptions Discharge Medication List as of 12/13/2016  5:41 PM    START taking these medications   Details  azithromycin (ZITHROMAX Z-PAK) 250 MG tablet Per package instructions, Print      I personally performed the services described in this documentation, which was scribed in my presence. The recorded information has been reviewed and  considered.    Jerry Grizzleanielle Iyla Balzarini, MD 12/13/16 2003

## 2016-12-13 NOTE — ED Triage Notes (Signed)
Pt here for URI sx with cough and body aches x 4 days

## 2017-01-01 ENCOUNTER — Inpatient Hospital Stay (HOSPITAL_COMMUNITY): Admission: RE | Admit: 2017-01-01 | Payer: Self-pay | Source: Ambulatory Visit

## 2017-01-01 ENCOUNTER — Emergency Department (HOSPITAL_COMMUNITY)
Admission: EM | Admit: 2017-01-01 | Discharge: 2017-01-01 | Disposition: A | Discharge status: Home / Self Care | Payer: Self-pay | Attending: Emergency Medicine | Admitting: Emergency Medicine

## 2017-01-01 ENCOUNTER — Encounter (HOSPITAL_COMMUNITY): Payer: Self-pay

## 2017-01-01 ENCOUNTER — Ambulatory Visit (HOSPITAL_COMMUNITY)
Admission: RE | Admit: 2017-01-01 | Discharge: 2017-01-01 | Disposition: A | Discharge status: Home / Self Care | Payer: Self-pay | Source: Ambulatory Visit | Attending: Emergency Medicine | Admitting: Emergency Medicine

## 2017-01-01 DIAGNOSIS — M79662 Pain in left lower leg: Secondary | ICD-10-CM | POA: Insufficient documentation

## 2017-01-01 DIAGNOSIS — M79609 Pain in unspecified limb: Secondary | ICD-10-CM

## 2017-01-01 DIAGNOSIS — Z79899 Other long term (current) drug therapy: Secondary | ICD-10-CM | POA: Insufficient documentation

## 2017-01-01 MED ORDER — ACETAMINOPHEN 325 MG PO TABS
650.0000 mg | ORAL_TABLET | Freq: Once | ORAL | Status: AC
  Administered 2017-01-01: 650 mg via ORAL
  Filled 2017-01-01: qty 2

## 2017-01-01 MED ORDER — RIVAROXABAN 15 MG PO TABS
15.0000 mg | ORAL_TABLET | Freq: Once | ORAL | Status: AC
  Administered 2017-01-01: 15 mg via ORAL
  Filled 2017-01-01: qty 1

## 2017-01-01 NOTE — Discharge Instructions (Signed)
Return in the morning for doppler test to make sure you don't have a blood clot in your leg. If the test shows no blood clot, then you should take anti-inflammatory pain medication like ibuprofen or naproxen. Celecoxib (Celebrex) is an anti-inflammatory medication that is easier on your stomach, but requires a doctor's prescription.  If the doppler test does show a blood clot, then you need to be put on blood thinners, and you should not take anti-inflammatory medication.

## 2017-01-01 NOTE — ED Notes (Signed)
Called and left a message with patient info and phone # for vascular lab to call with appointment. Patient was instructed that they will call with appointment.

## 2017-01-01 NOTE — ED Provider Notes (Signed)
[facility] Provider Note   CSN: [id number] Arrival date & time: 01/01/17  0009   By signing my name below, I, Jerry Larson, attest that this documentation has been prepared under the direction and in the presence of [name], MD  Electronically Signed: Clovis Larson, ED Scribe. 01/01/17. 3:13 AM.   History   Chief Complaint Chief Complaint  Patient presents with  . Leg Pain   The history is provided by the patient. No language interpreter was used.   HPI Comments:  Jerry Larson is a 61 y.o. male who presents to the Emergency Department complaining of persistent "3/10" left lower leg pain onset 1 week. His pain is worse with ambulation. No alleviating factors noted. Pt denies back pain or any other associated symptoms.   Past Medical History:  Diagnosis Date  . Acid reflux   . Anxiety   . DDD (degenerative disc disease)   . Diverticulitis large intestine 09/2013   CT proven-sigmoid  . Diverticulitis of sigmoid colon 10/15/2013  . Gout   . Heart palpitations   . Hemorrhoids   . Hiatal hernia   . Hyperlipidemia   . Obesity   . Osteopenia   . Pulmonary nodule     Patient Active Problem List   Diagnosis Date Noted  . Diverticulosis of colon without hemorrhage 06/20/2014  . Pulmonary nodules 05/25/2014  . Cough 05/25/2014  . Hemorrhoids, internal, with bleeding 02/01/2014  . GERD (gastroesophageal reflux disease) 09/28/2013  . Dyslipidemia 09/01/2013  . Chest pain 09/01/2013  . Palpitations 09/01/2013  . Gout     Past Surgical History:  Procedure Laterality Date  . COLONOSCOPY    . ESOPHAGOGASTRODUODENOSCOPY  12/25/13  . HEMORRHOID BANDING      Home Medications    Prior to Admission medications   Medication Sig Start Date End Date Taking? Authorizing Provider  azithromycin (ZITHROMAX Z-PAK) 250 MG tablet Per package instructions 12/13/16   [name], MD  esomeprazole (NEXIUM) 20 MG capsule Take 20 mg by mouth daily as needed (indigestion.).  Reported on 12/19/2015    Historical Provider, MD  naproxen (NAPROSYN) 500 MG tablet Take 1 tablet (500 mg total) by mouth 2 (two) times daily. 05/22/16   Joycie Peek, PA-C  pantoprazole (PROTONIX) 40 MG tablet Take 1 tablet (40 mg total) by mouth daily. 12/09/16   [name], MD    Family History Family History  Problem Relation Age of Onset  . Pancreatic cancer Father   . Hypertension Mother   . Hypertension Sister   . Colon cancer Neg Hx   . Stomach cancer Neg Hx     Social History Social History  Substance Use Topics  . Smoking status: Never Smoker  . Smokeless tobacco: Never Used  . Alcohol use No     Allergies   Aspirin; Lipitor [atorvastatin calcium]; and Zocor [simvastatin]   Review of Systems Review of Systems  Cardiovascular: Negative for leg swelling.  Musculoskeletal: Positive for myalgias. Negative for back pain.  Skin: Negative for color change.  All other systems reviewed and are negative.  Physical Exam Updated Vital Signs BP 133/75 (BP Location: Left Arm)   Pulse 63   Temp 97.8 F (36.6 C) (Oral)   Resp 18   SpO2 97%   Physical Exam  Constitutional: He is oriented to person, place, and time. He appears well-developed and well-nourished.  HENT:  Head: Normocephalic and atraumatic.  Eyes: EOM are normal. Pupils are equal, round, and reactive to light.  Neck: Normal range  of motion. Neck supple. No JVD present.  Cardiovascular: Normal rate, regular rhythm and normal heart sounds.   No murmur heard. Pulmonary/Chest: Effort normal and breath sounds normal. He has no wheezes. He has no rales. He exhibits no tenderness.  Abdominal: Soft. Bowel sounds are normal. He exhibits no distension and no mass. There is no tenderness.  Musculoskeletal: Normal range of motion. He exhibits no edema or tenderness.  No calf tenderness. Calf circumference is equal. No erythema or warmth. Negative Homan's sign. No back tenderness. Positive straight leg raise on left  at 45 degrees.   Lymphadenopathy:    He has no cervical adenopathy.  Neurological: He is alert and oriented to person, place, and time. No cranial nerve deficit. He exhibits normal muscle tone. Coordination normal.  Skin: Skin is warm and dry. No rash noted. No erythema.  Psychiatric: He has a normal mood and affect. His behavior is normal. Judgment and thought content normal.  Nursing note and vitals reviewed.  ED Treatments / Results  DIAGNOSTIC STUDIES:  Oxygen Saturation is 97% on RA, normal by my interpretation.    COORDINATION OF CARE:  3:05 AM Discussed treatment plan with pt at bedside and pt agreed to plan.   Procedures Procedures (including critical care time)  Medications Ordered in ED Medications - No data to display   Initial Impression / Assessment and Plan / ED Course  I have reviewed the triage vital signs and the nursing notes.   A left calf pain without any physical findings except for positive straight leg raise. I strongly suspect that this is a form of sciatica. However, he is being sent for venous Doppler to rule out DVT. He has no risk factors for DVT. He is given a dose of rivaroxaban and is to return in the morning for venous Doppler. If negative, he can be treated with NSAIDs. He has a history of gastric sensitivity to aspirin, so celecoxib might be a better choice.  Final Clinical Impressions(s) / ED Diagnoses   Final diagnoses:  Pain of left calf    New Prescriptions New Prescriptions   No medications on file   I personally performed the services described in this documentation, which was scribed in my presence. The recorded information has been reviewed and is accurate.       Dione Boozeavid Kaelan Emami, MD 01/01/17 [phone]

## 2017-01-01 NOTE — ED Triage Notes (Signed)
Pt states that for the past three weeks has been having L leg pain, no redness or warmth, small bit of swelling, reports difficulty walking.

## 2017-01-01 NOTE — Progress Notes (Addendum)
VASCULAR LAB PRELIMINARY  PRELIMINARY  PRELIMINARY  PRELIMINARY  Bilateral lower extremity venous duplex completed.    Preliminary report:  Left:  No evidence of DVT, superficial thrombosis, or Baker's cyst.  Jerry Larson, RVS 01/01/2017, 3:25 PM

## 2017-01-15 ENCOUNTER — Emergency Department (HOSPITAL_COMMUNITY)
Admission: EM | Admit: 2017-01-15 | Discharge: 2017-01-16 | Disposition: A | Discharge status: Home / Self Care | Payer: BLUE CROSS/BLUE SHIELD | Attending: Emergency Medicine | Admitting: Emergency Medicine

## 2017-01-15 ENCOUNTER — Encounter (HOSPITAL_COMMUNITY): Payer: Self-pay | Admitting: Emergency Medicine

## 2017-01-15 DIAGNOSIS — Z79899 Other long term (current) drug therapy: Secondary | ICD-10-CM | POA: Insufficient documentation

## 2017-01-15 DIAGNOSIS — M10031 Idiopathic gout, right wrist: Secondary | ICD-10-CM | POA: Insufficient documentation

## 2017-01-15 DIAGNOSIS — M79641 Pain in right hand: Secondary | ICD-10-CM | POA: Diagnosis present

## 2017-01-15 NOTE — ED Triage Notes (Signed)
Pt c/o R wrist pain and swelling x 2 days. Endorses hx of gout, states he has taken allopurinol and ibuprofen without relief. Pt states he cannot make a fist d/t pain. Denies injury. Pulses 3+ bilaterally.

## 2017-01-16 MED ORDER — PREDNISONE 10 MG PO TABS: 20.0000 mg | ORAL_TABLET | Freq: Two times a day (BID) | ORAL | 0 refills | Status: AC

## 2017-01-16 MED ORDER — OXYCODONE-ACETAMINOPHEN 5-325 MG PO TABS: 1.0000 | ORAL_TABLET | ORAL | 0 refills | Status: DC | PRN

## 2017-01-16 NOTE — ED Provider Notes (Signed)
[facility]-EMERGENCY DEPT Provider Note   CSN: [id number] Arrival date & time: 01/15/17  2106     History   Chief Complaint Chief Complaint  Patient presents with  . Gout    HPI Jerry Larson is a 61 y.o. male who presents to the ED with pain and swelling to the right hand. Patient has hx of gout and this feels the same. He denies n.v, fever, chills. He has taken ibuprofen and allopurinol without relief. He denies injury to his hand. He denies any other problems tonight.   HPI   Past Medical History:  Diagnosis Date  . Acid reflux   . Anxiety   . DDD (degenerative disc disease)   . Diverticulitis large intestine 09/2013   CT proven-sigmoid  . Diverticulitis of sigmoid colon 10/15/2013  . Gout   . Heart palpitations   . Hemorrhoids   . Hiatal hernia   . Hyperlipidemia   . Obesity   . Osteopenia   . Pulmonary nodule     Patient Active Problem List   Diagnosis Date Noted  . Diverticulosis of colon without hemorrhage 06/20/2014  . Pulmonary nodules 05/25/2014  . Cough 05/25/2014  . Hemorrhoids, internal, with bleeding 02/01/2014  . GERD (gastroesophageal reflux disease) 09/28/2013  . Dyslipidemia 09/01/2013  . Chest pain 09/01/2013  . Palpitations 09/01/2013  . Gout     Past Surgical History:  Procedure Laterality Date  . COLONOSCOPY    . ESOPHAGOGASTRODUODENOSCOPY  12/25/13  . HEMORRHOID BANDING         Home Medications    Prior to Admission medications   Medication Sig Start Date End Date Taking? Authorizing Provider  esomeprazole (NEXIUM) 20 MG capsule Take 20 mg by mouth daily as needed (indigestion.). Reported on 12/19/2015    Historical Provider, MD  oxyCODONE-acetaminophen (PERCOCET/ROXICET) 5-325 MG tablet Take 1 tablet by mouth every 4 (four) hours as needed for severe pain. 01/16/17   Hope Orlene OchM Neese, NP  pantoprazole (PROTONIX) 40 MG tablet Take 1 tablet (40 mg total) by mouth daily. 12/09/16   Dione Boozeavid Glick, MD  predniSONE (DELTASONE) 10 MG tablet Take 2  tablets (20 mg total) by mouth 2 (two) times daily with a meal. 01/16/17   Hope Orlene OchM Neese, NP    Family History Family History  Problem Relation Age of Onset  . Pancreatic cancer Father   . Hypertension Mother   . Hypertension Sister   . Colon cancer Neg Hx   . Stomach cancer Neg Hx     Social History Social History  Substance Use Topics  . Smoking status: Never Smoker  . Smokeless tobacco: Never Used  . Alcohol use No     Allergies   Aspirin; Lipitor [atorvastatin calcium]; and Zocor [simvastatin]   Review of Systems Review of Systems  Constitutional: Negative for chills and fever.  Gastrointestinal: Negative for nausea and vomiting.  Musculoskeletal: Positive for arthralgias.       Hand pain  Skin: Negative for wound.  Psychiatric/Behavioral: Negative for confusion.     Physical Exam Updated Vital Signs BP (!) 131/52 (BP Location: Left Arm)   Pulse 64   Temp 98.7 F (37.1 C) (Oral)   Resp 19   SpO2 96%   Physical Exam  Constitutional: He appears well-developed and well-nourished. No distress.  HENT:  Head: Normocephalic.  Eyes: EOM are normal.  Neck: Neck supple.  Cardiovascular: Normal rate.   Pulmonary/Chest: Effort normal.  Musculoskeletal:       Right wrist: He exhibits tenderness and  swelling.       Right hand: He exhibits tenderness and swelling. He exhibits normal capillary refill, no deformity and no laceration. Decreased range of motion: due to swelling. Normal sensation noted.  There is tenderness to the right wrist at the radial aspect that radiates to the dorsum of the right hand near the thumb. Swelling noted, radial pulse 2+, adequate circulation.   Neurological: He is alert.  Skin: Skin is warm and dry.  Psychiatric: He has a normal mood and affect.  Nursing note and vitals reviewed.    ED Treatments / Results  Labs (all labs ordered are listed, but only abnormal results are displayed) Labs Reviewed - No data to display  Radiology No  results found.  Procedures Procedures (including critical care time)  Medications Ordered in ED Medications - No data to display   Initial Impression / Assessment and Plan / ED Course  I have reviewed the triage vital signs and the nursing notes.  Pertinent labs & imaging results that were available during my care of the patient were reviewed by me and considered in my medical decision making (see chart for details).   Final Clinical Impressions(s) / ED Diagnoses  61 y.o. male with right wrist/hand pain that is similar to episodes of gout he has had in the past. Will treat with pain medication and prednisone. Patient to f/u with his PCP on Monday, 12/21/16 as scheduled.  Return precautions.  Final diagnoses:  Acute idiopathic gout of right wrist    New Prescriptions Discharge Medication List as of 01/16/2017 12:35 AM    START taking these medications   Details  oxyCODONE-acetaminophen (PERCOCET/ROXICET) 5-325 MG tablet Take 1 tablet by mouth every 4 (four) hours as needed for severe pain., Starting Sat 01/16/2017, Print    predniSONE (DELTASONE) 10 MG tablet Take 2 tablets (20 mg total) by mouth 2 (two) times daily with a meal., Starting Sat 01/16/2017, Print         [place], NP 01/16/17 0301    [name], MD 01/16/17 [phone]

## 2017-01-24 ENCOUNTER — Emergency Department (HOSPITAL_COMMUNITY)
Admission: EM | Admit: 2017-01-24 | Discharge: 2017-01-25 | Disposition: A | Discharge status: Home / Self Care | Payer: BLUE CROSS/BLUE SHIELD | Attending: Emergency Medicine | Admitting: Emergency Medicine

## 2017-01-24 ENCOUNTER — Encounter (HOSPITAL_COMMUNITY): Payer: Self-pay | Admitting: Oncology

## 2017-01-24 DIAGNOSIS — M19031 Primary osteoarthritis, right wrist: Secondary | ICD-10-CM

## 2017-01-24 DIAGNOSIS — G5601 Carpal tunnel syndrome, right upper limb: Secondary | ICD-10-CM | POA: Insufficient documentation

## 2017-01-24 DIAGNOSIS — M25531 Pain in right wrist: Secondary | ICD-10-CM | POA: Diagnosis present

## 2017-01-24 MED ORDER — ACETAMINOPHEN 500 MG PO TABS
1000.0000 mg | ORAL_TABLET | Freq: Once | ORAL | Status: AC
  Administered 2017-01-25: 1000 mg via ORAL
  Filled 2017-01-24: qty 2

## 2017-01-24 MED ORDER — DEXAMETHASONE SODIUM PHOSPHATE 4 MG/ML IJ SOLN
8.0000 mg | Freq: Once | INTRAMUSCULAR | Status: AC
  Administered 2017-01-25: 8 mg via INTRAMUSCULAR
  Filled 2017-01-24: qty 2

## 2017-01-24 NOTE — ED Provider Notes (Addendum)
[facility]-EMERGENCY DEPT Provider Note   CSN: [id number] Arrival date & time: 01/24/17  2134     History   Chief Complaint Chief Complaint  Patient presents with  . Hand Pain    HPI Jerry Larson is a 61 y.o. male.  Pt has a history of gout. He was treated about 1 week for similar problem. Pt now has increase rt hand pain. Some tingling in 4th and 5th fingers.   The history is provided by the patient.  Wrist Pain  This is a recurrent problem. The current episode started yesterday. The problem occurs constantly. The problem has not changed since onset.Pertinent negatives include no chest pain, no abdominal pain, no headaches and no shortness of breath. The symptoms are aggravated by bending (movement or palpation). Nothing relieves the symptoms. He has tried nothing for the symptoms. The treatment provided no relief.    Past Medical History:  Diagnosis Date  . Acid reflux   . Anxiety   . DDD (degenerative disc disease)   . Diverticulitis large intestine 09/2013   CT proven-sigmoid  . Diverticulitis of sigmoid colon 10/15/2013  . Gout   . Heart palpitations   . Hemorrhoids   . Hiatal hernia   . Hyperlipidemia   . Obesity   . Osteopenia   . Pulmonary nodule     Patient Active Problem List   Diagnosis Date Noted  . Diverticulosis of colon without hemorrhage 06/20/2014  . Pulmonary nodules 05/25/2014  . Cough 05/25/2014  . Hemorrhoids, internal, with bleeding 02/01/2014  . GERD (gastroesophageal reflux disease) 09/28/2013  . Dyslipidemia 09/01/2013  . Chest pain 09/01/2013  . Palpitations 09/01/2013  . Gout     Past Surgical History:  Procedure Laterality Date  . COLONOSCOPY    . ESOPHAGOGASTRODUODENOSCOPY  12/25/13  . HEMORRHOID BANDING         Home Medications    Prior to Admission medications   Medication Sig Start Date End Date Taking? Authorizing Provider  esomeprazole (NEXIUM) 20 MG capsule Take 20 mg by mouth daily as needed (indigestion.). Reported  on 12/19/2015    Historical Provider, MD  oxyCODONE-acetaminophen (PERCOCET/ROXICET) 5-325 MG tablet Take 1 tablet by mouth every 4 (four) hours as needed for severe pain. 01/16/17   Hope Orlene OchM Neese, NP  pantoprazole (PROTONIX) 40 MG tablet Take 1 tablet (40 mg total) by mouth daily. 12/09/16   Dione Boozeavid Glick, MD  predniSONE (DELTASONE) 10 MG tablet Take 2 tablets (20 mg total) by mouth 2 (two) times daily with a meal. 01/16/17   Hope Orlene OchM Neese, NP    Family History Family History  Problem Relation Age of Onset  . Pancreatic cancer Father   . Hypertension Mother   . Hypertension Sister   . Colon cancer Neg Hx   . Stomach cancer Neg Hx     Social History Social History  Substance Use Topics  . Smoking status: Never Smoker  . Smokeless tobacco: Never Used  . Alcohol use No     Allergies   Aspirin; Lipitor [atorvastatin calcium]; and Zocor [simvastatin]   Review of Systems Review of Systems  Constitutional: Negative for activity change.       All ROS Neg except as noted in HPI  HENT: Negative for nosebleeds.   Eyes: Negative for photophobia and discharge.  Respiratory: Negative for cough, shortness of breath and wheezing.   Cardiovascular: Negative for chest pain and palpitations.  Gastrointestinal: Negative for abdominal pain and blood in stool.  Genitourinary: Negative for dysuria, frequency  and hematuria.  Musculoskeletal: Positive for arthralgias. Negative for back pain and neck pain.  Skin: Negative.   Neurological: Negative for dizziness, seizures, speech difficulty and headaches.  Psychiatric/Behavioral: The patient is nervous/anxious.      Physical Exam Updated Vital Signs BP 131/83 (BP Location: Left Arm)   Pulse 71   Temp 98.3 F (36.8 C)   Resp 20   SpO2 96%   Physical Exam  Constitutional: He is oriented to person, place, and time. He appears well-developed and well-nourished.  Non-toxic appearance.  HENT:  Head: Normocephalic.  Right Ear: Tympanic membrane and  external ear normal.  Left Ear: Tympanic membrane and external ear normal.  Eyes: EOM and lids are normal. Pupils are equal, round, and reactive to light.  Neck: Normal range of motion. Neck supple. Carotid bruit is not present.  Cardiovascular: Normal rate, regular rhythm, normal heart sounds, intact distal pulses and normal pulses.   Pulmonary/Chest: Breath sounds normal. No respiratory distress.  Abdominal: Soft. Bowel sounds are normal. There is no tenderness. There is no guarding.  Musculoskeletal:       Right wrist: He exhibits decreased range of motion and tenderness.       Arms: Positive Tinel's sign.  Lymphadenopathy:       Head (right side): No submandibular adenopathy present.       Head (left side): No submandibular adenopathy present.    He has no cervical adenopathy.  Neurological: He is alert and oriented to person, place, and time. He has normal strength. No cranial nerve deficit or sensory deficit.  Skin: Skin is warm and dry.  Psychiatric: He has a normal mood and affect. His speech is normal.  Nursing note and vitals reviewed.    ED Treatments / Results  Labs (all labs ordered are listed, but only abnormal results are displayed) Labs Reviewed - No data to display  EKG  EKG Interpretation None       Radiology No results found.  Procedures Procedures (including critical care time) Wrist splint applied by orthopedic Tech. After application, the cap refill is less than 2 sec. No temp changes of the right hand. Pt reports being comfortable. Pt tolerated procedure without problem. Medications Ordered in ED Medications  dexamethasone (DECADRON) injection 8 mg (not administered)  acetaminophen (TYLENOL) tablet 1,000 mg (not administered)     Initial Impression / Assessment and Plan / ED Course  I have reviewed the triage vital signs and the nursing notes.  Pertinent labs & imaging results that were available during my care of the patient were reviewed by  me and considered in my medical decision making (see chart for details).     **I have reviewed nursing notes, vital signs, and all appropriate lab and imaging results for this patient.*  Final Clinical Impressions(s) / ED Diagnoses MDM No gross neuro deficit. Good cap refill. No hot joint. Pt has hx of DJD. Doubt this pain is gout. Positive Tinell's sign. Pt to be treated with possible carpal tunnel and DJD. Pt referred to orthopedics.   Final diagnoses:  Carpal tunnel syndrome on right  Primary osteoarthritis of right wrist    New Prescriptions New Prescriptions   No medications on file     Ivery Quale, Cordelia Poche 01/25/17 1643    Laurence Spates, MD 01/26/17 1515    Ivery Quale, PA-C 02/08/17 1702    Laurence Spates, MD 02/09/17 2029

## 2017-01-24 NOTE — ED Triage Notes (Signed)
Pt c/o right hand pain.  Pt dx w/ gout 1 week ago, has been taking allopurinol.  Pt states the pain did get better however last night his right hand hurt so much it was difficult to sleep. Pt rates pain 10/10, sharp in nature.

## 2017-01-25 MED ORDER — OXYCODONE-ACETAMINOPHEN 5-325 MG PO TABS: ORAL_TABLET | ORAL | 0 refills | Status: AC

## 2017-01-25 MED ORDER — DEXAMETHASONE 4 MG PO TABS: ORAL_TABLET | ORAL | 0 refills | Status: AC

## 2017-01-25 NOTE — Discharge Instructions (Signed)
Please see Dr. Lajoyce Cornersuda for orthopedic evaluation of your wrist. Please use your wrist splint until seen by Dr. Lajoyce Cornersuda. Please use Decadron 2 times daily. Please use Percocet for pain not improved by Tylenol.

## 2021-07-22 ENCOUNTER — Encounter: Payer: Self-pay | Admitting: Internal Medicine
